# Patient Record
Sex: Female | Born: 1960 | Hispanic: Yes | Marital: Single | Smoking: Former smoker
Health system: Southern US, Community
[De-identification: ages and names within clinical notes are randomized; demographics above are authoritative.]

## PROBLEM LIST (undated history)

## (undated) DIAGNOSIS — D332 Benign neoplasm of brain, unspecified: Secondary | ICD-10-CM

## (undated) DIAGNOSIS — L719 Rosacea, unspecified: Secondary | ICD-10-CM

## (undated) DIAGNOSIS — F329 Major depressive disorder, single episode, unspecified: Secondary | ICD-10-CM

## (undated) DIAGNOSIS — J45909 Unspecified asthma, uncomplicated: Secondary | ICD-10-CM

## (undated) DIAGNOSIS — K219 Gastro-esophageal reflux disease without esophagitis: Secondary | ICD-10-CM

## (undated) DIAGNOSIS — F32A Depression, unspecified: Secondary | ICD-10-CM

## (undated) DIAGNOSIS — R51 Headache: Secondary | ICD-10-CM

## (undated) DIAGNOSIS — R519 Headache, unspecified: Secondary | ICD-10-CM

## (undated) HISTORY — DX: Rosacea, unspecified: L71.9

## (undated) HISTORY — DX: Gastro-esophageal reflux disease without esophagitis: K21.9

## (undated) HISTORY — DX: Major depressive disorder, single episode, unspecified: F32.9

## (undated) HISTORY — DX: Headache, unspecified: R51.9

## (undated) HISTORY — DX: Headache: R51

## (undated) HISTORY — DX: Depression, unspecified: F32.A

## (undated) HISTORY — DX: Unspecified asthma, uncomplicated: J45.909

## (undated) HISTORY — PX: TUBAL LIGATION: SHX77

## (undated) HISTORY — PX: REDUCTION MAMMAPLASTY: SUR839

## (undated) HISTORY — PX: BREAST REDUCTION SURGERY: SHX8

## (undated) HISTORY — DX: Benign neoplasm of brain, unspecified: D33.2

---

## 1995-08-26 HISTORY — PX: BRAIN SURGERY: SHX531

## 2014-12-12 ENCOUNTER — Ambulatory Visit (INDEPENDENT_AMBULATORY_CARE_PROVIDER_SITE_OTHER): Payer: Medicare HMO | Admitting: Neurology

## 2014-12-12 ENCOUNTER — Encounter: Payer: Self-pay | Admitting: Neurology

## 2014-12-12 VITALS — BP 132/78 | HR 74 | Resp 16 | Ht 60.0 in | Wt 139.6 lb

## 2014-12-12 DIAGNOSIS — G43019 Migraine without aura, intractable, without status migrainosus: Secondary | ICD-10-CM

## 2014-12-12 DIAGNOSIS — Z8669 Personal history of other diseases of the nervous system and sense organs: Secondary | ICD-10-CM | POA: Diagnosis not present

## 2014-12-12 DIAGNOSIS — G894 Chronic pain syndrome: Secondary | ICD-10-CM | POA: Insufficient documentation

## 2014-12-12 DIAGNOSIS — Z87898 Personal history of other specified conditions: Secondary | ICD-10-CM

## 2014-12-12 MED ORDER — BACLOFEN 10 MG PO TABS: 5.0000 mg | ORAL_TABLET | Freq: Three times a day (TID) | ORAL | Status: DC | PRN

## 2014-12-12 MED ORDER — SUMATRIPTAN SUCCINATE 50 MG PO TABS: ORAL_TABLET | ORAL | Status: AC

## 2014-12-12 NOTE — Patient Instructions (Addendum)
1.  Stop sumatriptan 25mg  tablets.  Take sumatriptan 50mg  at earliest onset of headache.  May repeat tablet once in 2 hours if needed.  If ineffective, then the next time you have a migraine, take the first dose of sumatriptan along with naproxen 500mg  (over the counter). 2.  For left sided neck and arm cramps, take baclofen 5mg  up to three times daily as needed. 3.  We will get MRI of brain with and without contrast [facility]  12/29/14 11:45am  4.  Follow up in 2 months.

## 2014-12-12 NOTE — Progress Notes (Signed)
NEUROLOGY CONSULTATION NOTE  Richard Holz MRN: [id number] DOB: 1961/01/17  Referring provider: Dr. Vista Lawman Primary care provider: Dr. Vista Lawman  Reason for consult:  Migraine.  HISTORY OF PRESENT ILLNESS: Linda Munoz is a 54 year old right-handed woman from [place] with migraine, asthma, depression, low-back pain and history of tobacco abuse who presents for migraine.  Records reviewed.  Onset:  Since age 26.  Headaches have gotten worse lately because they have been lasting longer. Location:  Left-sided Quality:  pounding Intensity:  8-9/10 Aura:  no Prodrome:  no Associated symptoms:  Photophobia, phonophobia, blurred vision.  Sometimes nausea. Duration:  2 days Frequency:  Twice a month Triggers/exacerbating factors:  no Relieving factors:  no Activity:  Lays down to sleep  Past abortive therapy:  Fioricet (ineffective), hydrocodone Past preventative therapy:  none  Current abortive therapy:  sumatriptan 25mg  (effective but headache returns, also makes sleepy), acetaminophen helpful but headache returns.  She does not take pain reliever at earliest onset of headache. Current preventative therapy:  none Other medication:  doxepin 25mg , clonazepam 0.5-1mg , albuterol, nabumetone, tizanidine 2mg  for back pain  In the late 90s, she had brain surgery for removal of an unspecified benign tumor.  Since that time, she has had chronic pain down the left side of her neck and arm, associated with numbness in the left side of face, arm and leg.  It feels like soreness.  She notes swelling along the left side of the neck.  The pain gets worse when she has a migraine.  She has tried gabapentin in the past, which was ineffective.  Caffeine:  1 cup of coffee daily Alcohol:  no Smoker:  former Diet:  Keeps hydrated Exercise:  no Depression/stress:  Depression (on Paxil, ineffective) Sleep hygiene:  poor Family history of headache:  No family history.  No family history of  aneurysms.  PAST MEDICAL HISTORY: Past Medical History  Diagnosis Date  . Headache   . Asthma     PAST SURGICAL HISTORY: Past Surgical History  Procedure Laterality Date  . Brain surgery      tumor 1997  . Tubal ligation      MEDICATIONS: No current outpatient prescriptions on file prior to visit.   No current facility-administered medications on file prior to visit.    ALLERGIES: Allergies  Allergen Reactions  . Norflex [Orphenadrine Citrate]   . Sulfa Antibiotics     FAMILY HISTORY: Family History  Problem Relation Age of Onset  . Hypertension Sister     SOCIAL HISTORY: History   Social History  . Marital Status: Single    Spouse Name: N/A  . Number of Children: N/A  . Years of Education: N/A   Occupational History  . Not on file.   Social History Main Topics  . Smoking status: Never Smoker   . Smokeless tobacco: Never Used  . Alcohol Use: No  . Drug Use: No  . Sexual Activity: No   Other Topics Concern  . Not on file   Social History Narrative  . No narrative on file    REVIEW OF SYSTEMS: Constitutional: No fevers, chills, or sweats, no generalized fatigue, change in appetite Eyes: No visual changes, double vision, eye pain Ear, nose and throat: No hearing loss, ear pain, nasal congestion, sore throat Cardiovascular: No chest pain, palpitations Respiratory:  No shortness of breath at rest or with exertion, wheezes GastrointestinaI: No nausea, vomiting, diarrhea, abdominal pain, fecal incontinence Genitourinary:  No dysuria, urinary retention or frequency Musculoskeletal:  Neck and arm pain Integumentary: No rash, pruritus, skin lesions Neurological: as above Psychiatric: depression, insomnia Endocrine: No palpitations, fatigue, diaphoresis, mood swings, change in appetite, change in weight, increased thirst Hematologic/Lymphatic:  No anemia, purpura, petechiae. Allergic/Immunologic: no itchy/runny eyes, nasal congestion, recent allergic  reactions, rashes  PHYSICAL EXAM: Filed Vitals:   12/12/14 1441  BP: 132/78  Pulse: 74  Resp: 16   General: No acute distress Head:  Normocephalic/atraumatic Eyes:  fundi unremarkable, without vessel changes, exudates, hemorrhages or papilledema. Neck: supple, left sided tenderness, full range of motion Back: No paraspinal tenderness Heart: regular rate and rhythm Lungs: Clear to auscultation bilaterally. Vascular: No carotid bruits. Neurological Exam: Mental status: alert and oriented to person, place, and time, recent and remote memory intact, fund of knowledge intact, attention and concentration intact, speech fluent and not dysarthric, language intact. Cranial nerves: CN I: not tested CN II: pupils equal, round and reactive to light, visual fields intact, fundi unremarkable, without vessel changes, exudates, hemorrhages or papilledema. CN III, IV, VI:  full range of motion, no nystagmus, no ptosis CN V: endorses reduced light touch sensation on left V1-V3, as well as reduced vibration sensation on left side of forehead. CN VII: upper and lower face symmetric CN VIII: hearing intact CN IX, X: gag intact, uvula midline CN XI: sternocleidomastoid and trapezius muscles intact CN XII: tongue midline Bulk & Tone: normal, no fasciculations. Motor:  5-/5 left triceps and left grip.  Otherwise 5/5. Sensation:  Reduced pinprick sensation in the hands, left arm and leg.  Reduced vibration sensation left arm and leg. Deep Tendon Reflexes:  2+ throughout, toes downgoing Finger to nose testing:  No dysmetria Heel to shin:  No dysmetria Gait:  Normal station and stride.  Able to turn and walk in tandem. Romberg negative.  IMPRESSION: Migraine without aura Left sided pain syndrome History of brain tumor resection  PLAN: Since the headaches are infrequent, we will hold off on a preventative. 1.  At earliest onset of headache, instructed to take sumatriptan 50mg  with or without 500mg   naproxen 2.  Due to muscle cramping/muscle soreness on left side of neck and arm, will try trial of baclofen 5mg  3.  Given history of brain tumor and current worsening of headaches, will get MRI of brain with and without contrast.  45 minutes spent with patient, over 50% spent discussing management.  Thank you for allowing me to take part in the care of this patient.  Metta Clines, DO  CC:  Benito Mccreedy, MD

## 2014-12-29 ENCOUNTER — Ambulatory Visit (HOSPITAL_COMMUNITY): Admission: RE | Admit: 2014-12-29 | Payer: Medicare HMO | Source: Ambulatory Visit

## 2015-01-08 ENCOUNTER — Other Ambulatory Visit: Payer: Self-pay | Admitting: Physician Assistant

## 2015-01-08 DIAGNOSIS — R109 Unspecified abdominal pain: Secondary | ICD-10-CM

## 2015-01-09 ENCOUNTER — Ambulatory Visit
Admission: RE | Admit: 2015-01-09 | Discharge: 2015-01-09 | Disposition: A | Discharge status: Home / Self Care | Payer: Medicare HMO | Source: Ambulatory Visit | Attending: Physician Assistant | Admitting: Physician Assistant

## 2015-01-09 DIAGNOSIS — R109 Unspecified abdominal pain: Secondary | ICD-10-CM

## 2015-01-25 ENCOUNTER — Encounter: Payer: Self-pay | Admitting: Neurology

## 2015-01-25 ENCOUNTER — Ambulatory Visit (INDEPENDENT_AMBULATORY_CARE_PROVIDER_SITE_OTHER): Payer: Medicare HMO | Admitting: Neurology

## 2015-01-25 VITALS — BP 130/70 | HR 70 | Resp 16 | Ht 60.0 in | Wt 140.0 lb

## 2015-01-25 DIAGNOSIS — G894 Chronic pain syndrome: Secondary | ICD-10-CM | POA: Diagnosis not present

## 2015-01-25 DIAGNOSIS — Z8669 Personal history of other diseases of the nervous system and sense organs: Secondary | ICD-10-CM

## 2015-01-25 DIAGNOSIS — G43019 Migraine without aura, intractable, without status migrainosus: Secondary | ICD-10-CM

## 2015-01-25 DIAGNOSIS — Z87898 Personal history of other specified conditions: Secondary | ICD-10-CM

## 2015-01-25 MED ORDER — RIZATRIPTAN BENZOATE 10 MG PO TABS: 10.0000 mg | ORAL_TABLET | ORAL | Status: DC | PRN

## 2015-01-25 MED ORDER — TOPIRAMATE 25 MG PO TABS: 25.0000 mg | ORAL_TABLET | Freq: Every day | ORAL | Status: DC

## 2015-01-25 NOTE — Patient Instructions (Addendum)
1.  Start topiramate 25mg  at bedtime.  Possible side effects include: impaired thinking, sedation, paresthesias (numbness and tingling) and weight loss.  It may cause dehydration and there is a small risk for kidney stones, so make sure to stay hydrated with water during the day.  There is also a very small risk for glaucoma, so if you notice any change in your vision while taking this medication, see an ophthalmologist.   2.  At earliest onset of headache, take Maxalt (rizatriptan) 10mg .  May repeat once in 2 hours if needed.  Do not exceed two tablets in 24 hours. 3.  Get MRI of brain  [facility] 02/05/15 10:45am 4.  CALL IN 4 WEEKS WITH UPDATE AND WE CAN ADJUST DOSE OF MEDICATION 5.  Follow up in 3 months.  6. STOP aspirin  1. Comenzar 25 mg de topiramato al [employer]. Los posibles efectos secundarios incluyen: alteracin de la capacidad, la sedacin, parestesias (entumecimiento y hormigueo) y prdida de [place]. Puede causar la deshidratacin y hay un pequeo riesgo de clculos renales, as que asegrese de [employer] hidratado con Pharmacist, community. Tambin hay un pequeo riesgo para el glaucoma, as que si usted nota algn cambio en su vista mientras est tomando este medicamento, consulte a Theatre stage manager. 2. Al inicio ms temprano del dolor de cabeza, tome Maxalt 10 mg (rizatriptn). Se puede repetir Dollene Cleveland en 2 horas si es necesario. No exceda de dos tabletas en 24 horas. [place] 13/06/16 10:45 am 4. LLAMADA EN 4 SEMANAS CON ACTUALIZACIN Y podemos ajustar dosis de la medicacin 5. Seguimiento en 3 meses. 6. Parar la Octavia Bruckner

## 2015-01-25 NOTE — Progress Notes (Addendum)
NEUROLOGY FOLLOW UP OFFICE NOTE  Linda Munoz [id number]  HISTORY OF PRESENT ILLNESS: Linda Munoz is a 54 year old right-handed woman from [place] with migraine, asthma, depression, low-back pain and history of tobacco abuse who follows up for migraine, left sided pain syndrome and history of brain tumor resection.  Interpretor is present.  UPDATE: Since last visit, her headaches have become more frequent.  She has daily headaches.  She thinks the baclofen made it worse.  As for migraines: Intensity:  8-9/10 Duration:  All day Frequency:  2-3 days per week Current abortive therapy:  sumatriptan 50mg  (ineffective), ASA 81mg  (daily, ineffective) Current preventative therapy:  none  MRI of brain was ordered, which was not performed because she missed the appointment.  HISTORY: Onset:  Since age 63.  Headaches have gotten worse lately because they have been lasting longer. Location:  Left-sided Quality:  pounding Initial Intensity:  8-9/10 Aura:  no Prodrome:  no Associated symptoms:  Photophobia, phonophobia, blurred vision.  Sometimes nausea. Initial Duration:  2 days Initial Frequency:  Twice a month Triggers/exacerbating factors:  no Relieving factors:  no Activity:  Lays down to sleep  Past abortive therapy:  Fioricet (ineffective), hydrocodone, naproxen (caused dizziness) Past preventative therapy:  none  Current abortive therapy:  sumatriptan 25mg  (effective but headache returns, also makes sleepy), acetaminophen helpful but headache returns.  She does not take pain reliever at earliest onset of headache. Current preventative therapy:  none Other medication:  doxepin 25mg , clonazepam 0.5-1mg , albuterol, nabumetone, tizanidine 2mg  for back pain  In the late 90s, she had brain surgery for removal of an unspecified benign tumor.  Since that time, she has had chronic pain down the left side of her neck and arm, associated with numbness in the left side of face, arm  and leg.  It feels like soreness.  She notes swelling along the left side of the neck.  The pain gets worse when she has a migraine.  She has tried gabapentin in the past, which was ineffective.  Caffeine:  1 cup of coffee daily Alcohol:  no Smoker:  former Diet:  Keeps hydrated Exercise:  no Depression/stress:  Depression (on Paxil, ineffective)  PAST MEDICAL HISTORY: Past Medical History  Diagnosis Date  . Headache   . Asthma     MEDICATIONS: Current Outpatient Prescriptions on File Prior to Visit  Medication Sig Dispense Refill  . AZELEX 20 % cream   5  . baclofen (LIORESAL) 10 MG tablet Take 0.5 tablets (5 mg total) by mouth 3 (three) times daily as needed for muscle spasms. 30 each 0  . Butalbital-APAP-Caffeine 50-300-40 MG CAPS   0  . doxepin (SINEQUAN) 25 MG capsule   2  . HYDROcodone-acetaminophen (NORCO/VICODIN) 5-325 MG per tablet   0  . montelukast (SINGULAIR) 10 MG tablet   0  . nabumetone (RELAFEN) 500 MG tablet   2  . PARoxetine (PAXIL) 20 MG tablet   0  . PROAIR HFA 108 (90 BASE) MCG/ACT inhaler   0  . SUMAtriptan (IMITREX) 50 MG tablet Take 1tab at earliest onset of headache.  May repeat x1 in 2 hours if headache persists or recurs. 10 tablet 0  . tiZANidine (ZANAFLEX) 2 MG tablet Take by mouth every 6 (six) hours as needed for muscle spasms.     No current facility-administered medications on file prior to visit.    ALLERGIES: Allergies  Allergen Reactions  . Norflex [Orphenadrine Citrate]   . Sulfa Antibiotics  FAMILY HISTORY: Family History  Problem Relation Age of Onset  . Hypertension Sister     SOCIAL HISTORY: History   Social History  . Marital Status: Single    Spouse Name: N/A  . Number of Children: N/A  . Years of Education: N/A   Occupational History  . Not on file.   Social History Main Topics  . Smoking status: Never Smoker   . Smokeless tobacco: Never Used  . Alcohol Use: No  . Drug Use: No  . Sexual Activity: No    Other Topics Concern  . Not on file   Social History Narrative  . No narrative on file    REVIEW OF SYSTEMS: Constitutional: No fevers, chills, or sweats, no generalized fatigue, change in appetite Eyes: No visual changes, double vision, eye pain Ear, nose and throat: No hearing loss, ear pain, nasal congestion, sore throat Cardiovascular: No chest pain, palpitations Respiratory:  No shortness of breath at rest or with exertion, wheezes GastrointestinaI: No nausea, vomiting, diarrhea, abdominal pain, fecal incontinence Genitourinary:  No dysuria, urinary retention or frequency Musculoskeletal:  No neck pain, back pain Integumentary: No rash, pruritus, skin lesions Neurological: as above Psychiatric: No depression, insomnia, anxiety Endocrine: No palpitations, fatigue, diaphoresis, mood swings, change in appetite, change in weight, increased thirst Hematologic/Lymphatic:  No anemia, purpura, petechiae. Allergic/Immunologic: no itchy/runny eyes, nasal congestion, recent allergic reactions, rashes  PHYSICAL EXAM: Filed Vitals:   01/25/15 0856  BP: 130/70  Pulse: 70  Resp: 16   General: No acute distress Head:  Normocephalic/atraumatic Eyes:  Fundoscopic exam unremarkable without vessel changes, exudates, hemorrhages or papilledema. Neck: supple, no paraspinal tenderness, full range of motion Heart:  Regular rate and rhythm Lungs:  Clear to auscultation bilaterally Back: No paraspinal tenderness Neurological Exam: alert and oriented to person, place, and time. Attention span and concentration intact, recent and remote memory intact, fund of knowledge intact.  Speech fluent and not dysarthric, language intact.  Reduced sensation in left side of face.  Otherwise, CN II-XII intact. Fundoscopic exam unremarkable without vessel changes, exudates, hemorrhages or papilledema.  Bulk and tone normal, muscle strength 5/5 throughout.  Sensation to light touch, temperature and vibration  intact.  Deep tendon reflexes 2+ throughout, toes downgoing.  Finger to nose and heel to shin testing intact.  Gait normal, Romberg negative.  IMPRESSION: Chronic migraine, intractable Left sided pain syndrome History of brain tumor resection  PLAN: 1.  Start topiramate 25mg  at bedtime 2.  Maxalt 10mg  3.  Stop ASA 4.  Call in 4 weeks with update.  Follow up in 3 months. 5.  Reschedule MRI of brain  15 minute spent with patient face to face, over 50% spent discussing management.  Metta Clines, DO  CC:  Benito Mccreedy, MD

## 2015-02-05 ENCOUNTER — Other Ambulatory Visit: Payer: Self-pay | Admitting: Neurology

## 2015-02-05 ENCOUNTER — Ambulatory Visit (HOSPITAL_COMMUNITY)
Admission: RE | Admit: 2015-02-05 | Discharge: 2015-02-05 | Disposition: A | Discharge status: Home / Self Care | Payer: Medicare HMO | Source: Ambulatory Visit | Attending: Neurology | Admitting: Neurology

## 2015-02-05 ENCOUNTER — Telehealth: Payer: Self-pay | Admitting: *Deleted

## 2015-02-05 DIAGNOSIS — Z1389 Encounter for screening for other disorder: Secondary | ICD-10-CM | POA: Insufficient documentation

## 2015-02-05 DIAGNOSIS — Z87898 Personal history of other specified conditions: Secondary | ICD-10-CM

## 2015-02-05 DIAGNOSIS — G43019 Migraine without aura, intractable, without status migrainosus: Secondary | ICD-10-CM | POA: Insufficient documentation

## 2015-02-05 DIAGNOSIS — Z8669 Personal history of other diseases of the nervous system and sense organs: Secondary | ICD-10-CM | POA: Diagnosis not present

## 2015-02-05 DIAGNOSIS — S0095XA Superficial foreign body of unspecified part of head, initial encounter: Secondary | ICD-10-CM

## 2015-02-05 DIAGNOSIS — G894 Chronic pain syndrome: Secondary | ICD-10-CM | POA: Diagnosis not present

## 2015-02-05 MED ORDER — GADOBENATE DIMEGLUMINE 529 MG/ML IV SOLN
14.0000 mL | Freq: Once | INTRAVENOUS | Status: AC | PRN
  Administered 2015-02-05: 14 mL via INTRAVENOUS

## 2015-02-05 NOTE — Telephone Encounter (Signed)
-----   Message from Pieter Partridge, DO sent at 02/05/2015  2:10 PM EDT ----- MRI of brain shows no tumors or other concerning findings.

## 2015-02-05 NOTE — Telephone Encounter (Signed)
Patient is aware of MRI results

## 2015-02-16 ENCOUNTER — Ambulatory Visit: Payer: Medicare HMO | Admitting: Neurology

## 2015-02-21 ENCOUNTER — Ambulatory Visit: Payer: Medicare HMO | Admitting: Neurology

## 2015-03-06 ENCOUNTER — Telehealth: Payer: Self-pay | Admitting: Neurology

## 2015-03-06 ENCOUNTER — Other Ambulatory Visit: Payer: Self-pay | Admitting: *Deleted

## 2015-03-06 DIAGNOSIS — G43009 Migraine without aura, not intractable, without status migrainosus: Secondary | ICD-10-CM

## 2015-03-06 MED ORDER — TOPIRAMATE 50 MG PO TABS: 50.0000 mg | ORAL_TABLET | Freq: Once | ORAL | Status: DC

## 2015-03-06 NOTE — Telephone Encounter (Signed)
For updates regarding migraines, I need to know: 1.  If the abortive medication is effective (in this case the Maxalt).  Is she taking it at earliest onset of headache? 2.  Over the past month, how many days of headache did she have.  We can increase topiramate to 50mg  at bedtime. If she is taking Maxalt at earliest onset of headache and it is still ineffective, we can prescribe her Relpax 40mg , taken at earliest onset of headache.  May repeat once dose in 2 hours if needed.  Do not exceed 2 tablets in 24 hours.  She should update [place] again in 4 weeks.  If Relpax ineffective after giving it a couple of tries, she should call sooner.

## 2015-03-06 NOTE — Telephone Encounter (Signed)
Patient called stating she is still having headache she is taking Topamax 25 mg  As well as Maxalt 10 mg  Please advise

## 2015-03-06 NOTE — Telephone Encounter (Signed)
Pt called to talk to someone about head pn, call back PH# is [phone]

## 2015-05-16 ENCOUNTER — Encounter: Payer: Self-pay | Admitting: Neurology

## 2015-05-16 ENCOUNTER — Ambulatory Visit (INDEPENDENT_AMBULATORY_CARE_PROVIDER_SITE_OTHER): Payer: Medicare HMO | Admitting: Neurology

## 2015-05-16 VITALS — BP 104/64 | HR 80 | Temp 97.7°F | Resp 16 | Ht 60.0 in | Wt 139.5 lb

## 2015-05-16 DIAGNOSIS — Z8669 Personal history of other diseases of the nervous system and sense organs: Secondary | ICD-10-CM

## 2015-05-16 DIAGNOSIS — M542 Cervicalgia: Secondary | ICD-10-CM | POA: Insufficient documentation

## 2015-05-16 DIAGNOSIS — Z87898 Personal history of other specified conditions: Secondary | ICD-10-CM

## 2015-05-16 DIAGNOSIS — G894 Chronic pain syndrome: Secondary | ICD-10-CM

## 2015-05-16 DIAGNOSIS — G43009 Migraine without aura, not intractable, without status migrainosus: Secondary | ICD-10-CM | POA: Diagnosis not present

## 2015-05-16 MED ORDER — TIZANIDINE HCL 2 MG PO TABS: 2.0000 mg | ORAL_TABLET | Freq: Three times a day (TID) | ORAL | Status: DC | PRN

## 2015-05-16 NOTE — Progress Notes (Signed)
NEUROLOGY FOLLOW UP OFFICE NOTE  Linda Munoz [id number]  HISTORY OF PRESENT ILLNESS: Linda Munoz is a 54 year old right-handed woman from [place] with migraine, asthma, depression, low-back pain and history of tobacco abuse who follows up for migraine, left sided pain syndrome and history of brain tumor resection, as well as paresthesias, which is a new issue.  Interpretor is present, although she speaks [place].  Images of brain MRI reviewed.  UPDATE: Migraines have resolved since increasing topiramate to 50mg .  Maxalt also is effective when she gets one.  For her pain syndrome, she was started on nortriptyline 25mg  last week.  She also takes Lyrica.  She continues to have muscle tension in the neck and shoulders.  She also reports numbness and tingling.  She has increased bowel movements since increasing the topiramate.  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  Headaches have gotten worse lately because they have been lasting longer. Location:  Left-sided Quality:  pounding Initial Intensity:  8-9/10; June 8-9/10 Aura:  no Prodrome:  no Associated symptoms:  Photophobia, phonophobia, blurred vision.  Sometimes nausea. Initial Duration:  2 days; June all day Initial Frequency:  Twice a month; June 2-3 days per week Triggers/exacerbating factors:  no Relieving factors:  no Activity:  Lays down to sleep  Past abortive therapy:  Sumatriptan 50mg , ASA 81mg , Fioricet (ineffective), hydrocodone, naproxen (caused dizziness) Past preventative therapy:  none  Current abortive therapy:  sumatriptan 25mg  (effective but headache returns, also makes sleepy), acetaminophen helpful but headache returns.  She does not take pain reliever at earliest onset of headache. Current preventative therapy:   none Other medication:  doxepin 25mg , clonazepam 0.5-1mg , albuterol, nabumetone, tizanidine 2mg  for back pain  In the late 90s, she had brain surgery for removal of an unspecified benign tumor.  Since that time, she has had chronic pain down the left side of her neck and arm, associated with numbness in the left side of face, arm and leg.  It feels like soreness.  She notes swelling along the left side of the neck.  The pain gets worse when she has a migraine.  She has tried gabapentin in the past, which was ineffective.  PAST MEDICAL HISTORY: Past Medical History  Diagnosis Date  . Headache   . Asthma     MEDICATIONS: Current Outpatient Prescriptions on File Prior to Visit  Medication Sig Dispense Refill  . montelukast (SINGULAIR) 10 MG tablet   0  . nabumetone (RELAFEN) 500 MG tablet   2  . PROAIR HFA 108 (90 BASE) MCG/ACT inhaler   0  . rizatriptan (MAXALT) 10 MG tablet Take 1 tablet (10 mg total) by mouth as needed for migraine. May repeat x1 in 2 hours if needed.  Do not exceed 2 tabs in 24 hours 10 tablet 0  . SUMAtriptan (IMITREX) 50 MG tablet Take 1tab at earliest onset of headache.  May repeat x1 in 2 hours if headache persists or recurs. 10 tablet 0  . topiramate (TOPAMAX) 50 MG tablet Take 1 tablet (50 mg total) by mouth once. I tablet  50mg   At HS  30 with 2 refills 30 tablet 2  . AZELEX 20 % cream   5  . baclofen (LIORESAL) 10 MG tablet Take 0.5 tablets (5 mg total) by mouth 3 (three) times daily as needed for  muscle spasms. (Patient not taking: Reported on 05/16/2015) 30 each 0  . Butalbital-APAP-Caffeine 50-300-40 MG CAPS   0  . doxepin (SINEQUAN) 25 MG capsule   2  . HYDROcodone-acetaminophen (NORCO/VICODIN) 5-325 MG per tablet   0  . PARoxetine (PAXIL) 20 MG tablet   0  . topiramate (TOPAMAX) 25 MG tablet Take 1 tablet (25 mg total) by mouth at bedtime. (Patient not taking: Reported on 05/16/2015) 30 tablet 0   No current facility-administered medications on file prior to  visit.    ALLERGIES: Allergies  Allergen Reactions  . Norflex [Orphenadrine Citrate]   . Sulfa Antibiotics     FAMILY HISTORY: Family History  Problem Relation Age of Onset  . Hypertension Sister     SOCIAL HISTORY: Social History   Social History  . Marital Status: Single    Spouse Name: N/A  . Number of Children: 1  . Years of Education: 14   Occupational History  . Not on file.   Social History Main Topics  . Smoking status: Never Smoker   . Smokeless tobacco: Never Used  . Alcohol Use: No  . Drug Use: No  . Sexual Activity: No   Other Topics Concern  . Not on file   Social History Narrative    REVIEW OF SYSTEMS: Constitutional: No fevers, chills, or sweats, no generalized fatigue, change in appetite Eyes: No visual changes, double vision, eye pain Ear, nose and throat: No hearing loss, ear pain, nasal congestion, sore throat Cardiovascular: No chest pain, palpitations Respiratory:  No shortness of breath at rest or with exertion, wheezes GastrointestinaI: No nausea, vomiting, diarrhea, abdominal pain, fecal incontinence Genitourinary:  No dysuria, urinary retention or frequency Musculoskeletal:  Neck pain, back pain Integumentary: No rash, pruritus, skin lesions Neurological: as above Psychiatric: No depression, insomnia, anxiety Endocrine: No palpitations, fatigue, diaphoresis, mood swings, change in appetite, change in weight, increased thirst Hematologic/Lymphatic:  No anemia, purpura, petechiae. Allergic/Immunologic: no itchy/runny eyes, nasal congestion, recent allergic reactions, rashes  PHYSICAL EXAM: Filed Vitals:   05/16/15 1434  BP: 104/64  Pulse: 80  Temp: 97.7 F (36.5 C)  Resp: 16   General: No acute distress.  Patient appears well-groomed.  Head:  Normocephalic/atraumatic Eyes:  Fundoscopic exam unremarkable without vessel changes, exudates, hemorrhages or papilledema. Neck: supple, bilateral paraspinal tenderness, full range of  motion Heart:  Regular rate and rhythm Lungs:  Clear to auscultation bilaterally Back: No paraspinal tenderness Neurological Exam: alert and oriented to person, place, and time. Attention span and concentration intact, recent and remote memory intact, fund of knowledge intact.  Speech fluent and not dysarthric, language intact.  Reduced sensation in left side of face.  Otherwise, CN II-XII intact. Fundoscopic exam unremarkable without vessel changes, exudates, hemorrhages or papilledema.  Bulk and tone normal, muscle strength 5/5 throughout.  Sensation to light touch, temperature and vibration intact.  Deep tendon reflexes 2+ throughout, toes downgoing.  Finger to nose and heel to shin testing intact.  Gait normal, Romberg negative.  IMPRESSION: Migraine without aura Neck pain History of brain tumor resection Numbness and tingling likely side effect of topiramate, although it is not too bad.  PLAN: Topiramate 50mg  daily Try Immodium for diarrhea Maxalt for abortive therapy Tizanidine 2mg  every 8 hours as needed for neck and shoulder pain.  15 minutes spent face to face with patient, over 50% spent discussing management.  Metta Clines, DO  CC:  Benito Mccreedy MD

## 2015-05-16 NOTE — Patient Instructions (Signed)
1.  Continue topiramate 50mg  at bedtime 2.  Take Maxalt 10mg  at earliest onset of headache.  May repeat once if needed in 2 hours. 3.  Take tizanidine 2mg  every 8 hours as needed for neck and shoulder pain 4.  Follow up in 3 months.

## 2015-06-05 ENCOUNTER — Other Ambulatory Visit: Payer: Self-pay | Admitting: Neurology

## 2015-06-05 NOTE — Telephone Encounter (Signed)
Rx sent 

## 2015-09-04 ENCOUNTER — Ambulatory Visit: Payer: Medicare HMO | Admitting: Neurology

## 2015-10-09 ENCOUNTER — Other Ambulatory Visit: Payer: Self-pay | Admitting: Neurology

## 2015-10-09 NOTE — Telephone Encounter (Signed)
Pt last seen in 04/2015. No showed f/u in 1/17. Message sent to reschedule f/u for further refills.

## 2015-11-07 ENCOUNTER — Other Ambulatory Visit: Payer: Self-pay | Admitting: Neurology

## 2015-11-07 NOTE — Telephone Encounter (Signed)
Last OV: 05/16/15 Next OV: 0/0/0 Will deny. Note was sent on last refill to schedule appointment for further refills.

## 2016-09-04 ENCOUNTER — Other Ambulatory Visit: Payer: Self-pay | Admitting: Family Medicine

## 2016-09-04 DIAGNOSIS — R102 Pelvic and perineal pain: Secondary | ICD-10-CM

## 2016-09-09 ENCOUNTER — Ambulatory Visit
Admission: RE | Admit: 2016-09-09 | Discharge: 2016-09-09 | Disposition: A | Discharge status: Home / Self Care | Payer: Medicare Other | Source: Ambulatory Visit | Attending: Family Medicine | Admitting: Family Medicine

## 2016-09-09 ENCOUNTER — Other Ambulatory Visit: Payer: Self-pay | Admitting: Family Medicine

## 2016-09-09 DIAGNOSIS — R102 Pelvic and perineal pain: Secondary | ICD-10-CM

## 2016-09-15 ENCOUNTER — Other Ambulatory Visit: Payer: Self-pay | Admitting: Family Medicine

## 2016-09-15 DIAGNOSIS — N644 Mastodynia: Secondary | ICD-10-CM

## 2016-09-24 ENCOUNTER — Ambulatory Visit
Admission: RE | Admit: 2016-09-24 | Discharge: 2016-09-24 | Disposition: A | Discharge status: Home / Self Care | Payer: Medicare Other | Source: Ambulatory Visit | Attending: Family Medicine | Admitting: Family Medicine

## 2016-09-24 DIAGNOSIS — N644 Mastodynia: Secondary | ICD-10-CM

## 2016-12-16 ENCOUNTER — Encounter: Payer: Self-pay | Admitting: Physical Medicine & Rehabilitation

## 2016-12-16 ENCOUNTER — Encounter: Payer: Medicare Other | Attending: Physical Medicine & Rehabilitation

## 2016-12-16 ENCOUNTER — Ambulatory Visit (HOSPITAL_BASED_OUTPATIENT_CLINIC_OR_DEPARTMENT_OTHER): Payer: Medicare Other | Admitting: Physical Medicine & Rehabilitation

## 2016-12-16 VITALS — BP 127/85 | HR 67

## 2016-12-16 DIAGNOSIS — R202 Paresthesia of skin: Secondary | ICD-10-CM | POA: Diagnosis not present

## 2016-12-16 DIAGNOSIS — M7542 Impingement syndrome of left shoulder: Secondary | ICD-10-CM | POA: Insufficient documentation

## 2016-12-16 DIAGNOSIS — G890 Central pain syndrome: Secondary | ICD-10-CM | POA: Diagnosis not present

## 2016-12-16 DIAGNOSIS — M542 Cervicalgia: Secondary | ICD-10-CM

## 2016-12-16 DIAGNOSIS — M791 Myalgia: Secondary | ICD-10-CM | POA: Insufficient documentation

## 2016-12-16 DIAGNOSIS — Z79899 Other long term (current) drug therapy: Secondary | ICD-10-CM | POA: Insufficient documentation

## 2016-12-16 DIAGNOSIS — Z87898 Personal history of other specified conditions: Secondary | ICD-10-CM

## 2016-12-16 DIAGNOSIS — G894 Chronic pain syndrome: Secondary | ICD-10-CM | POA: Diagnosis not present

## 2016-12-16 NOTE — Progress Notes (Signed)
Shoulder injection Left    Indication:Left Shoulder pain not relieved by medication management and other conservative care.  Informed consent was obtained after describing risks and benefits of the procedure with the patient, this includes bleeding, bruising, infection and medication side effects. The patient wishes to proceed and has given written consent. Patient was placed in a seated position. TheLeft shoulder was marked and prepped with betadine in the subacromial area. A 25-gauge 1-1/2 inch needle was inserted into the subacromial area. After negative draw back for blood, a solution containing 1 mL of 6 mg per ML betamethasone and 4 mL of 1% lidocaine was injected. A band aid was applied. The patient tolerated the procedure well. Post procedure instructions were given.

## 2016-12-16 NOTE — Progress Notes (Signed)
Subjective:     Linda Munoz is a 56 y.o. female who presents for evaluation of neck pain. Event that precipitated these symptoms: Retro-mastoid left cerebellar resection for tumor. Onset of symptoms was 21 Years ago, and have been gradually worsening since that time. Current symptoms are Left side of neck, going into the left shoulder, elbow, hand, and fingers, occasionally left breast. Patient denies weakness in Left shoulder, arm or hand. Patient has had no prior neck problems and No issues prior to left retromastoid cerebellar resection. Previous treatments: physical therapy and medication: NSAID: Not helpful, analgesic: Tried lyrica, gabapentin, tramadol, hydrocodone without benefit, muscle relaxant: tizanidine not helpful, also tried cymbalta.Has had side effect from several meds as listed in allergy section. Patient has been evaluated by neurology, Dr. Tomi Likens, who ordered MRI of the head with and without contrast as listed below. As per his note in 2016, the patient has complained of left-sided headache, neck pain and arm pain since her brain tumor surgery.  She has previously been on gabapentin and Lyrica for arm pain. Has been on Pamelor for headache prophylaxis, Most recent neurology note indicates topiramate 50 mg a day. Tizanidine 2 mg every 8 hours as needed for neck and shoulder pain. Maxalt for headache abortive therapy.  Started on Lexapro for depressive symptoms in January by her primary care physician. Also started on Seroquel by PCP in February Previously has been followed by mental health  Review of Systems Eyes: positive for Blurriness occasional   Please see other note filed for today Objective:    BP 127/85   Pulse 67   SpO2 98%  General:   alert, cooperative, appears stated age and no distress  External Deformity:  absent  ROM Cervical Spine:  normal range of motion, flexion to 45 degrees, extension to 45 degrees, left rotation to 45 degrees, right rotation to 60  degrees, left lateral bending to 45 degrees and right lateral bending to 60 degrees  Midline Tenderness:  absent on the left  Paraspinous tenderness:  moderate on the left  UE Neurologic Exam:  5/5 strength in right deltoid, bicep, tricep, grip, bilateral hip flexor, knee extensor, ankle dorsiflexor, 4/5 left deltoid, bicep, tricep, grip   Left shoulder. Positive impingement sign at 90. Also has pain over the left acromioclavicular joint and increases with adduction. Has tenderness over the upper trapezius on the left side  Prior imaging studies  CLINICAL DATA:  Severe headaches for several days per week not helped by medication. Remote history of brain tumor surgery.   EXAM: MRI HEAD WITHOUT AND WITH CONTRAST   TECHNIQUE: Multiplanar, multiecho pulse sequences of the brain and surrounding structures were obtained without and with intravenous contrast.   CONTRAST:  99mL MULTIHANCE GADOBENATE DIMEGLUMINE 529 MG/ML IV SOLN   COMPARISON:  None.   FINDINGS: Sequelae of prior left retromastoid craniotomy are identified. There is encephalomalacia laterally in the left cerebellar hemisphere. No mass or abnormal enhancement is identified in this region to suggest recurrent tumor.   There is no evidence of acute infarct, intracranial hemorrhage, mass, midline shift, or extra-axial fluid collection. Ventricles are normal in size for age. No significant white matter disease is identified. No abnormal brain parenchymal or meningeal enhancement is identified. Pituitary is grossly unremarkable.   Orbits are unremarkable. Paranasal sinuses and mastoid air cells are clear. Major intracranial vascular flow voids are preserved.   IMPRESSION: 1. Prior left retromastoid craniotomy with underlying cerebellar encephalomalacia. No evidence of recurrent mass. 2. Otherwise unremarkable appearance of  the brain.     Electronically Signed   By: Logan Bores   On: 02/05/2015 13:47  Cervical spine  MRI from November 2017 was reviewed. Pertinent positive finding is mild to moderate left foraminal stenosis at C5-C6. Remaining levels without evidence of significant stenosis, mild degenerative disc was noted, no central stenosis. Assessment:    Cervical myofascial pain as well as chronic postoperative paresthesias, status post left sub-mastoid craniotomy   2. Left shoulder subacromial impingement syndrome Plan:  Continue current medications, she has tried appropriate medications, but these were not helpful for either her paresthesias in her left upper extremity or neck pain.  Discussed appropriate exercises. Follow up in  1 month. Left shoulder injection today

## 2016-12-16 NOTE — Progress Notes (Signed)
   Subjective:    Patient ID: Linda Munoz, female    DOB: [birth year], 56 y.o.   MRN: [id number]  HPI   Pain Inventory Average Pain 8 Pain Right Now 8 My pain is burning, tingling and aching  In the last 24 hours, has pain interfered with the following? General activity 7 Relation with others 6 Enjoyment of life 5 What TIME of day is your pain at its worst? night Sleep (in general) Fair  Pain is worse with: some activites Pain improves with: none Relief from Meds: 0  Mobility walk without assistance ability to climb steps?  yes do you drive?  yes  Function not employed: date last employed 2010  Neuro/Psych numbness depression  Prior Studies Any changes since last visit?  no  Physicians involved in your care Any changes since last visit?  no   Family History  Problem Relation Age of Onset  . Hypertension Sister    Social History   Social History  . Marital status: Single    Spouse name: N/A  . Number of children: 1  . Years of education: 89   Social History Main Topics  . Smoking status: Never Smoker  . Smokeless tobacco: Never Used  . Alcohol use No  . Drug use: No  . Sexual activity: No   Other Topics Concern  . Not on file   Social History Narrative  . No narrative on file   Past Surgical History:  Procedure Laterality Date  . BRAIN SURGERY  1997   tumor 1997  . BREAST REDUCTION SURGERY Bilateral   . TUBAL LIGATION     Past Medical History:  Diagnosis Date  . Asthma   . Headache    There were no vitals taken for this visit.  Opioid Risk Score:   Fall Risk Score:  `1  Depression screen PHQ 2/9  No flowsheet data found.  Review of Systems  Constitutional: Negative.   HENT: Negative.   Eyes: Negative.   Respiratory: Positive for shortness of breath.   Cardiovascular: Negative.   Gastrointestinal: Negative.   Endocrine: Negative.   Genitourinary: Negative.   Musculoskeletal: Negative.   Skin: Negative.     Allergic/Immunologic: Negative.   Neurological: Negative.   Hematological: Negative.   Psychiatric/Behavioral: Negative.   All other systems reviewed and are negative.      Objective:   Physical Exam        Assessment & Plan:

## 2016-12-16 NOTE — Patient Instructions (Signed)
Ejercicios para los hombros (Shoulder Exercises) Consulte al mdico qu ejercicios son seguros para usted. Haga los ejercicios exactamente como se lo haya indicado el mdico y gradelos como se lo hayan indicado. Es normal sentir tirantez, tensin, presin o molestias leves mientras hace estos ejercicios, pero debe detenerse de inmediato si siente dolor repentino o si el dolor empeora.No comience a hacer estos ejercicios hasta que se lo indique el mdico. EJERCICIOS DE AMPLITUD DE MOVIMIENTOS  Estos ejercicios calientan los msculos y las articulaciones, y mejoran el movimiento y la flexibilidad del hombro. Estos ejercicios tambin ayudan a Best boy, el adormecimiento y el hormigueo. En estos ejercicios, el hombro lesionado se estira directamente. EjercicioA: Pndulo  1. Prese cerca de una pared o una superficie de la que pueda sostenerse para mantener el equilibrio. 2. Flexione la cintura y deje que el brazo izquierdo/derecho cuelgue extendido. Use el otro brazo para apoyarse. Mantenga la espalda derecha y no trabe las rodillas. 3. Relaje los msculos del brazo y Adult nurse izquierdo/derecho, y Programmer, systems la cadera y el tronco de modo que el brazo izquierdo/derecho se balancee libremente. El brazo debe balancearse por el movimiento del cuerpo, no por la fuerza de los msculos del brazo o el hombro. 4. Contine moviendo el cuerpo de modo que el brazo se balancee en las siguientes direcciones, como se lo haya indicado el mdico:  De lado a lado.  Hacia delante y Alvin.  En crculos, en el sentido de las agujas del [place] y en sentido contrario. 1. Contine cada movimiento durante __________ segundos o durante el tiempo que le haya indicado el mdico. 2. Vuelva lentamente a la posicin inicial. Repita __________ veces. Realice este ejercicio __________ veces al da. EjercicioB:Flexin, de pie  1. Prese y sostenga un palo de escoba, un bastn o un objeto similar. Coloque las manos  separadas a una distancia un poco mayor que el ancho de sus hombros. [place] izquierda/derecha debe estar con la palma Dwyane Luo arriba, y la otra mano debe estar con la palma hacia abajo. 2. Mantenga el codo extendido y los msculos del hombro relajados. Baje el palo con el brazo sano para levantar el brazo izquierdo/derecho frente a su cuerpo y [employer] la cabeza hasta sentir un estiramiento en el hombro.  Evite encoger el hombro mientras levanta el brazo. Mantenga los omplatos juntos, llvelos hacia el centro de la espalda. 1. Mantenga esta posicin durante __________ segundos. 2. Vuelva lentamente a la posicin inicial. Repita __________ veces. Realice este ejercicio __________ veces al da. EjercicioC: Abduccin, de pie  1. Prese y sostenga un palo de escoba, un bastn o un objeto similar. Coloque las manos separadas a una distancia un poco mayor que el ancho de sus hombros. [place] izquierda/derecha debe estar con la palma Dwyane Luo arriba, y la otra mano debe estar con la palma hacia abajo. 2. Mientras mantiene el codo extendido y los msculos del hombro relajados, empuje el palo por adelante de su cuerpo hacia el lado izquierdo/derecho. [place] el brazo izquierdo/derecho al costado del cuerpo y [employer] la cabeza hasta sentir un estiramiento en el hombro.  No levante el brazo por encima de la altura del hombro, a menos que el mdico se lo indique.  Evite encoger el hombro mientras levanta el brazo. Mantenga los omplatos juntos, llvelos hacia el centro de la espalda. 1. Mantenga esta posicin durante __________ segundos. 2. Vuelva lentamente a la posicin inicial. Repita __________ veces. Realice este ejercicio __________ veces al da. Ejercicio D:Rotacin  interna  1. Coloque la mano izquierda/derecha detrs de la espalda, con la palma [place] arriba. 2. Con la otra mano, cuelgue una banda para ejercicios, una toalla o un objeto similar sobre el hombro. Agarre la banda con la mano  izquierda/derecha, de modo de tener agarrados ambos extremos. 3. Suavemente, tire de la banda hacia arriba hasta sentir un estiramiento en la parte de adelante del hombro izquierdo/derecho.  Evite encoger el hombro mientras levanta el brazo. Mantenga los omplatos juntos, llvelos hacia el centro de la espalda. 1. Mantenga esta posicin durante __________ segundos. 2. Afloje el estiramiento, suelte la banda y [employer]. Repita __________ veces. Realice este ejercicio __________ veces al da. EJERCICIOS DE ESTIRAMIENTO  Estos ejercicios calientan los msculos y las articulaciones, y mejoran el movimiento y la flexibilidad del hombro. Estos ejercicios tambin ayudan a Best boy, el adormecimiento y el hormigueo. Estos ejercicios se hacen con el hombro sano, que ayuda a Training and development officer los msculos del hombro lesionado. EjercicioE: Estiramiento en una esquina (abduccin y rotacin externa)  1. Prese en una entrada de una puerta con un pie adelante del otro, a una corta distancia uno del otro. Esto se denomina escalonamiento. Si no llega a apoyar los [employer] de la [place], prese frente a una esquina de la habitacin. 2. Elija una de estas posiciones, como se lo haya indicado el mdico:  [place] y los antebrazos sobre el marco de la [place], por encima de la cabeza.  [place] y los antebrazos sobre el marco de la puerta, a la altura de la cabeza.  [place] y los antebrazos sobre el marco de la puerta, a la altura de los codos. 1. Lentamente, lleve el peso al pie de adelante hasta sentir un estiramiento en el pecho y en la parte de adelante de los hombros. Mantenga la cabeza y el pecho erguidos, y los msculos abdominales tensionados. 2. Mantenga esta posicin durante __________ segundos. 3. Para aflojar el estiramiento, lleve el peso al pie de atrs. Repita __________ veces. Realice este estiramiento __________ veces al da. Ejercicio F:Extensin, de  pie  1. Prese y sostenga un palo de escoba, un bastn o un objeto similar detrs de la espalda.  Las manos deben estar separadas a una distancia un poco mayor que el ancho de sus hombros.  Las palmas deben estar en direccin contraria a la espalda. 1. Manteniendo los codos extendidos y los msculos de los hombros relajados, aleje el palo de su cuerpo Nurse, children's sentir un estiramiento en el hombro.  No encoja los hombros al Teacher, English as a foreign language. Mantenga los omplatos juntos, llvelos hacia el centro de la espalda. 1. Mantenga esta posicin durante __________ segundos. 2. Vuelva lentamente a la posicin inicial. Repita __________ veces. Realice este ejercicio __________ veces al da. EJERCICIOS DE FORTALECIMIENTO  Estos ejercicios fortalecen el hombro y le otorgan resistencia. La resistencia es la capacidad de usar los msculos durante un tiempo prolongado, incluso despus de que se cansen. Ejercicio G:Rotacin externa  1. Sintese en una silla estable que no tenga apoyabrazos. 2. Ate una banda para ejercicios a la altura del codo del lado izquierdo/derecho. 3. Coloque un objeto blando, como una toalla doblada o una almohada pequea entre la parte superior del brazo izquierdo/derecho y el cuerpo, de forma que el codo est separado del costado del cuerpo a una distancia de unas pocas pulgadas (alrededor de 10cm). 4. Sostenga el extremo de la banda para que quede tensa y no se  afloje. 5. Con el codo presionado sobre el objeto blando, aleje el antebrazo izquierdo/derecho del abdomen. No mueva el cuerpo; solo Financial risk analyst. 6. Mantenga esta posicin durante __________ segundos. 7. Vuelva lentamente a la posicin inicial. Repita __________ veces. Realice este ejercicio __________ veces al da. EjercicioH:Abduccin del hombro  1. Sintese en una silla estable que no tenga apoyabrazos o pngase de pie. 2. Sostenga una pesa de __________ con la mano izquierda/derecha, o una banda para ejercicios  con ambas manos. 3. Comience con los brazos extendidos Wendi Maya y con la palma izquierda/derecha hacia adentro, en direccin a su cuerpo. 4. Lentamente, levante la mano izquierda/derecha hacia el costado. No levante la mano por encima de la altura del hombro, a menos que el mdico le diga que no hay riesgos.  Mantenga los brazos extendidos.  No encoja los hombros al [employer]. Mantenga los omplatos juntos, llvelos hacia el centro de la espalda. 1. Mantenga esta posicin durante __________ segundos. 2. Baje lentamente el brazo y vuelva a la posicin inicial. Repita __________ veces. Realice este ejercicio __________ veces al da. Ejercicio I:Extensin del hombro  1. Sintese en una silla estable que no tenga apoyabrazos o pngase de pie. 2. Ate una banda para ejercicios a un objeto estable que est frente a usted, a la altura del hombro. 3. Sostenga un extremo de la banda para ejercicios en cada mano. Las palmas deben estar enfrentadas. 4. Extienda los codos y [employer] manos a la altura de los hombros. 5. [place] hacia atrs, alejndose del extremo fijo de la banda para ejercicios, hasta que [place] se tense y no quede floja. 6. Junte los omplatos y medida que baja las manos hacia los costados de los muslos. Detngase cuando las manos estn en la misma posicin en ambos costados. No deje que las manos vayan hacia atrs del cuerpo. 7. Mantenga esta posicin durante __________ segundos. 8. Vuelva lentamente a la posicin inicial. Repita __________ veces. Realice este ejercicio __________ veces al da. EjercicioJ:De pie, remo con el hombro  1. Sintese en una silla estable que no tenga apoyabrazos o pngase de pie. 2. Ate una banda para ejercicios a un objeto estable que est frente a usted, de modo que la banda est a la altura de la cintura. 3. Sostenga un extremo de la banda para ejercicios en cada mano. Las palmas deben estar con los pulgares [place] arriba. 4. Flexione  ambos codos en forma de L (aproximadamente a 90grados) y [employer] parte superior de los brazos a los costados. 5. [place] hacia atrs hasta que la banda quede tensa y no se afloje. 6. Lentamente, lleve los codos hacia atrs de su cuerpo. 7. Mantenga esta posicin durante __________ segundos. 8. Vuelva lentamente a la posicin inicial. Repita __________ veces. Realice este ejercicio __________ veces al da. EjercicioK:Prensas de hombro  1. Sintese en una silla estable que tenga apoyabrazos. Sintese erguido, con los pies apoyados en el suelo. 2. Coloque las [employer] apoyabrazos, con los codos flexionados y los dedos apuntando hacia adelante. Las manos deben estar parejas a los lados de su cuerpo. 3. Presione con las [employer] apoyabrazos para levantarse de la silla. Extienda los codos y levntese todo lo que pueda, sin estar incmodo.  Lleve los omplatos hacia abajo y no levante los hombros hacia las [place].  Mantenga los pies en el suelo. A medida que adquiere ms fuerza, los pies deben sostener menos peso del cuerpo [place] se [place]. [place]  posicin durante __________ segundos. 2. Lentamente, vuelva a sentarse en la silla. Repita __________ veces. Realice este ejercicio __________ veces al da. EjercicioL: Flexiones de [employer] pared  1. Prese frente a una pared estable. Coloque los pies separados de la pared a una distancia aproximada de un brazo. 2. Osvaldo Angst adelante y coloque las palmas sobre la pared a la altura de los hombros. 3. Sin levantar los pies del suelo, flexione los codos e inclnese hacia la pared. 4. Mantenga esta posicin durante __________ segundos. 5. Extienda los codos para regresar a la posicin inicial. Repita __________ veces. Realice este ejercicio __________ veces al da. Esta informacin no tiene Marine scientist el consejo del mdico. Asegrese de hacerle al mdico cualquier pregunta que tenga. Document  Released: 02/02/2006 Document Revised: 09/01/2014 Document Reviewed: 04/22/2015 Elsevier Interactive Patient Education  2017 [employer].

## 2017-01-23 ENCOUNTER — Ambulatory Visit: Payer: Medicare Other | Admitting: Physical Medicine & Rehabilitation

## 2017-01-30 ENCOUNTER — Encounter: Payer: Self-pay | Admitting: Physical Medicine & Rehabilitation

## 2017-01-30 ENCOUNTER — Ambulatory Visit (HOSPITAL_BASED_OUTPATIENT_CLINIC_OR_DEPARTMENT_OTHER): Payer: Medicare Other | Admitting: Physical Medicine & Rehabilitation

## 2017-01-30 ENCOUNTER — Encounter: Payer: Medicare Other | Attending: Physical Medicine & Rehabilitation

## 2017-01-30 VITALS — BP 134/84 | HR 61

## 2017-01-30 DIAGNOSIS — M7542 Impingement syndrome of left shoulder: Secondary | ICD-10-CM | POA: Diagnosis not present

## 2017-01-30 DIAGNOSIS — Z79899 Other long term (current) drug therapy: Secondary | ICD-10-CM | POA: Insufficient documentation

## 2017-01-30 DIAGNOSIS — M7918 Myalgia, other site: Secondary | ICD-10-CM | POA: Insufficient documentation

## 2017-01-30 DIAGNOSIS — R202 Paresthesia of skin: Secondary | ICD-10-CM | POA: Insufficient documentation

## 2017-01-30 DIAGNOSIS — M791 Myalgia: Secondary | ICD-10-CM | POA: Diagnosis present

## 2017-01-30 MED ORDER — TIZANIDINE HCL 2 MG PO TABS: 2.0000 mg | ORAL_TABLET | Freq: Three times a day (TID) | ORAL | 0 refills | Status: DC | PRN

## 2017-01-30 NOTE — Progress Notes (Signed)
Subjective:    Patient ID: Linda Munoz, female    DOB: 1961/05/02, 56 y.o.   MRN: [id number]  HPI No improvement after left subacromial bursa injection. Pain his left side of the neck and shoulder blade area. No numbness or tingling in the left arm. No left arm weakness. Patient has a long history of neck pain greater than 20 years.  Tried PT in the past. They did heat as well as electrical stimulation. No history of dry needling Pain Inventory Average Pain 7 Pain Right Now 7 My pain is burning  In the last 24 hours, has pain interfered with the following? General activity 7 Relation with others 7 Enjoyment of life 7 What TIME of day is your pain at its worst? daytime Sleep (in general) Fair  Pain is worse with: . Pain improves with: . Relief from Meds: .  Mobility walk without assistance ability to climb steps?  yes do you drive?  yes  Function disabled: date disabled .  Neuro/Psych depression anxiety  Prior Studies Any changes since last visit?  no  Physicians involved in your care Any changes since last visit?  no   Family History  Problem Relation Age of Onset  . Hypertension Sister    Social History   Social History  . Marital status: Single    Spouse name: N/A  . Number of children: 1  . Years of education: 65   Social History Main Topics  . Smoking status: Never Smoker  . Smokeless tobacco: Never Used  . Alcohol use No  . Drug use: No  . Sexual activity: No   Other Topics Concern  . Not on file   Social History Narrative  . No narrative on file   Past Surgical History:  Procedure Laterality Date  . BRAIN SURGERY  1997   tumor 1997  . BREAST REDUCTION SURGERY Bilateral   . TUBAL LIGATION     Past Medical History:  Diagnosis Date  . Asthma   . Headache    There were no vitals taken for this visit.  Opioid Risk Score:   Fall Risk Score:  `1  Depression screen PHQ 2/9  Depression screen PHQ 2/9 12/16/2016  Decreased  Interest 1  Down, Depressed, Hopeless 1  PHQ - 2 Score 2  Altered sleeping 2  Tired, decreased energy 1  Change in appetite 2  Feeling bad or failure about yourself  0  Trouble concentrating 1  Moving slowly or fidgety/restless 2  Suicidal thoughts 0  PHQ-9 Score 10  Difficult doing work/chores Somewhat difficult    Review of Systems  Constitutional: Positive for unexpected weight change.  HENT: Negative.   Eyes: Negative.   Respiratory: Negative.   Cardiovascular: Negative.   Gastrointestinal: Negative.   Endocrine: Negative.   Genitourinary: Negative.   Musculoskeletal: Negative.   Skin: Negative.   Allergic/Immunologic: Negative.   Neurological: Negative.   Hematological: Negative.   Psychiatric/Behavioral: Negative.   All other systems reviewed and are negative.      Objective:   Physical Exam  Constitutional: She appears well-developed and well-nourished.  HENT:  Head: Normocephalic and atraumatic.  Nursing note and vitals reviewed.   Tenderness to palpation and some increased tone in the left trapezius, left levator, left infraspinatus. No tenderness over the right trapezius, levator or infraspinatus. Motor strength is normal in the upper limbs. Neck range of motion is within functional limits, although she does have some pain with neck range of motion  Assessment & Plan:  1. Chronic left-sided neck and shoulder pain. She has tenderness over the left trapezius, left levator as well as left infraspinatus. She has increased tone there. I think much of her pain is related to myofascial pain. She's never had dry needling or trigger point injections.  Continued tizanidine 2-4 mg daily at bedtime  Trigger Point Injection  Indication: Left cervical, parascapular Myofascial pain not relieved by medication management and other conservative care.  Informed consent was obtained after describing risk and benefits of the procedure with the patient, this includes  bleeding, bruising, infection and medication side effects.  The patient wishes to proceed and has given written consent.  The patient was placed in a seated position.  The Left trapezius and Left levator and Left infraspinatus area was marked and prepped with Betadine.  It was entered with a 25-gauge 1-1/2 inch needle and 1 mL of 1% lidocaine was injected into each of 3 trigger points with positive twitch sign at each of 3 areas, trigger point deactivation was performed after negative draw back for blood.  The patient tolerated the procedure well.  Post procedure instructions were given.

## 2017-01-30 NOTE — Patient Instructions (Signed)

## 2017-02-10 ENCOUNTER — Telehealth: Payer: Self-pay | Admitting: Physical Therapy

## 2017-02-10 NOTE — Telephone Encounter (Signed)
Attempted to call on 6/14, 6/18 & 6/19. Unable to leave message, no voice mail

## 2017-03-03 NOTE — Telephone Encounter (Signed)
Attempted to call on 03/03/17, phone disconnected

## 2017-03-13 ENCOUNTER — Encounter: Payer: Self-pay | Admitting: Physical Medicine & Rehabilitation

## 2017-03-13 ENCOUNTER — Encounter: Payer: Medicare Other | Attending: Physical Medicine & Rehabilitation

## 2017-03-13 ENCOUNTER — Ambulatory Visit (HOSPITAL_BASED_OUTPATIENT_CLINIC_OR_DEPARTMENT_OTHER): Payer: Medicare Other | Admitting: Physical Medicine & Rehabilitation

## 2017-03-13 VITALS — BP 125/75 | HR 65

## 2017-03-13 DIAGNOSIS — R202 Paresthesia of skin: Secondary | ICD-10-CM | POA: Diagnosis not present

## 2017-03-13 DIAGNOSIS — M7918 Myalgia, other site: Secondary | ICD-10-CM

## 2017-03-13 DIAGNOSIS — Z79899 Other long term (current) drug therapy: Secondary | ICD-10-CM | POA: Diagnosis not present

## 2017-03-13 DIAGNOSIS — M791 Myalgia: Secondary | ICD-10-CM | POA: Diagnosis present

## 2017-03-13 DIAGNOSIS — M7542 Impingement syndrome of left shoulder: Secondary | ICD-10-CM | POA: Diagnosis not present

## 2017-03-13 MED ORDER — TIZANIDINE HCL 4 MG PO TABS: 4.0000 mg | ORAL_TABLET | Freq: Every day | ORAL | 2 refills | Status: DC

## 2017-03-13 NOTE — Progress Notes (Signed)
Subjective:    Patient ID: Linda Munoz, female    DOB: 1960-09-16, 56 y.o.   MRN: [id number]  HPI Left handed No improvement with Left subacromial  inj Trigger point injection 01/30/2017 , left upper trapezius, left levator. Left infraspinatus, produced 2 day improvement in pain in the left neck and shoulder area Has had physical therapy years ago but never has tried dry needling. Pain Inventory Average Pain 7 Pain Right Now 7 My pain is sharp  In the last 24 hours, has pain interfered with the following? General activity 7 Relation with others 7 Enjoyment of life 0 What TIME of day is your pain at its worst? evening Sleep (in general) Fair  Pain is worse with: some activites Pain improves with: . Relief from Meds: 5  Mobility walk without assistance ability to climb steps?  no do you drive?  yes  Function Do you have any goals in this area?  no  Neuro/Psych No problems in this area  Prior Studies Any changes since last visit?  no  Physicians involved in your care Any changes since last visit?  no   Family History  Problem Relation Age of Onset  . Hypertension Sister    Social History   Social History  . Marital status: Single    Spouse name: N/A  . Number of children: 1  . Years of education: 90   Social History Main Topics  . Smoking status: Never Smoker  . Smokeless tobacco: Never Used  . Alcohol use No  . Drug use: No  . Sexual activity: No   Other Topics Concern  . Not on file   Social History Narrative  . No narrative on file   Past Surgical History:  Procedure Laterality Date  . BRAIN SURGERY  1997   tumor 1997  . BREAST REDUCTION SURGERY Bilateral   . TUBAL LIGATION     Past Medical History:  Diagnosis Date  . Asthma   . Headache    There were no vitals taken for this visit.  Opioid Risk Score:   Fall Risk Score:  `1  Depression screen PHQ 2/9  Depression screen PHQ 2/9 12/16/2016  Decreased Interest 1  Down,  Depressed, Hopeless 1  PHQ - 2 Score 2  Altered sleeping 2  Tired, decreased energy 1  Change in appetite 2  Feeling bad or failure about yourself  0  Trouble concentrating 1  Moving slowly or fidgety/restless 2  Suicidal thoughts 0  PHQ-9 Score 10  Difficult doing work/chores Somewhat difficult     Review of Systems  Constitutional: Positive for appetite change and unexpected weight change.  HENT: Negative.   Eyes: Negative.   Respiratory: Negative.   Cardiovascular: Negative.   Gastrointestinal: Negative.   Endocrine: Negative.   Genitourinary: Negative.   Musculoskeletal: Negative.   Skin: Negative.   Allergic/Immunologic: Negative.   Neurological: Negative.   Hematological: Negative.   Psychiatric/Behavioral: Negative.   All other systems reviewed and are negative.      Objective:   Physical Exam  Constitutional: She is oriented to person, place, and time. She appears well-developed and well-nourished.  HENT:  Head: Normocephalic and atraumatic.  Eyes: Pupils are equal, round, and reactive to light. Conjunctivae and EOM are normal.  Neck: Normal range of motion.  Neurological: She is alert and oriented to person, place, and time.  Psychiatric: She has a normal mood and affect.  Nursing note and vitals reviewed.  motor strength is 5/5 bilateral deltoid,  biceps, triceps, grip, Sensation normal. Bilateral upper limbs No evidence of atrophy in the upper extremities. No pain with shoulder range of motion Tenderness over the left trapezius, left levator scapula      Assessment & Plan:  1. Cervical myofascial pain with good response to trigger point injection performed on 01/30/2017. Left upper trapezius, left levator scapula, left infraspinatus were injected. Temporary relief. Would recommend physical therapy referral for dry needling of these muscle groups as well as some stretching and strengthening. Return to clinic in 8 weeks

## 2017-03-13 NOTE — Patient Instructions (Signed)
Please take 1 tablet of tizanidine at night, you are now on a 4 mg dose, not 2 mg   Physical therapy will contact you to set up an appointment for dry needling

## 2017-03-31 ENCOUNTER — Encounter: Payer: Self-pay | Admitting: Physical Therapy

## 2017-03-31 ENCOUNTER — Ambulatory Visit: Payer: Medicare Other | Attending: Physical Medicine & Rehabilitation | Admitting: Physical Therapy

## 2017-03-31 DIAGNOSIS — R252 Cramp and spasm: Secondary | ICD-10-CM | POA: Diagnosis present

## 2017-03-31 DIAGNOSIS — M542 Cervicalgia: Secondary | ICD-10-CM | POA: Insufficient documentation

## 2017-03-31 DIAGNOSIS — R293 Abnormal posture: Secondary | ICD-10-CM | POA: Insufficient documentation

## 2017-03-31 NOTE — Therapy (Signed)
[place], [place], [address] Phone: [phone]   Fax:  [phone]  Physical Therapy Evaluation  Patient Details  Name: Linda Munoz MRN: [id number] Date of Birth: 04-Jan-1961 Referring Provider: Letta Pate  Encounter Date: 03/31/2017      PT End of Session - 03/31/17 1530    Visit Number 1   Date for PT Re-Evaluation 05/31/17   PT Start Time 1436   PT Stop Time 1531   PT Time Calculation (min) 55 min   Activity Tolerance Patient tolerated treatment well   Behavior During Therapy [facility] for tasks assessed/performed      Past Medical History:  Diagnosis Date  . Asthma   . Headache     Past Surgical History:  Procedure Laterality Date  . BRAIN SURGERY  1997   tumor 1997  . BREAST REDUCTION SURGERY Bilateral   . TUBAL LIGATION      There were no vitals filed for this visit.       Subjective Assessment - 03/31/17 1445    Subjective Patient reports that she had a brain surgery in 1997, she reports that she has always had some tightness in the left neck and upper trap area, she reports that she feels like it is worse over the past 3 years.  She is unsure of a cause.    The MD feels like it is all myofascial.     Patient Stated Goals have less pain   Currently in Pain? Yes   Pain Score 7    Pain Location Neck   Pain Orientation Left   Pain Descriptors / Indicators Tightness;Aching   Pain Type Chronic pain   Pain Radiating Towards at times has pain in the hand, reports that at times the hand can be purple on the left   Pain Onset More than a month ago   Pain Frequency Constant   Aggravating Factors  right rotation makes it worse, winter , pain up to a 10/10   Pain Relieving Factors pain meds, the pain at best is a 7/10   Effect of Pain on Daily Activities reports she tries to do everything but has pain all the time            [facility] PT Assessment - 03/31/17 0001       Assessment   Medical Diagnosis left neck and shoulder myofascial pain   Referring Provider Kirsteins   Onset Date/Surgical Date 02/28/17   Hand Dominance Left   Next MD Visit 1 months   Prior Therapy 4 years ago     Precautions   Precautions None     Balance Screen   Has the patient fallen in the past 6 months No   Has the patient had a decrease in activity level because of a fear of falling?  No   Is the patient reluctant to leave their home because of a fear of falling?  No     Home Environment   Additional Comments lives alone, does her own housework     Prior Function   Level of Independence Independent   Vocation On disability   Vocation Requirements back injury, ruptured disc   Leisure treadmill 3x/week     Posture/Postural Control   Posture Comments rounded shoulders, leans to the right away from the painful side, head is in a little right side bending     ROM / Strength   AROM / PROM / Strength AROM;Strength  AROM   Overall AROM Comments Cervical ROM was decreased 50% for all motions, she had pain with all motions, her left side bending was decreased 100%, Left shoulder flexion 135 degrees, ER/IR WFL's but slow and painful, Abduction 135 degrees     Strength   Overall Strength Comments 4-/5 for the UE's with pain with MMT on the left     Palpation   Palpation comment She has significant tenderness in the left upper trap, the levator, the rhomboid and the cervical parapsinals on the left, she has a visible size difference in the upper trap, she has a HA on a weekly basis and reports that at times it will last for days     Special Tests    Special Tests Thoracic Outlet Syndrome   Thoracic Outlet Syndrome  Adson Test;Arms overhead     Adson Test   Findings Negative   Side  Left     Arms overhead    Side Left   Comment diminshed but did not completely go away            Objective measurements completed on examination: See above findings.             Trigger Point Dry Needling - 03/31/17 1529    Consent Given? Yes   Education Handout Provided Yes   Muscles Treated Upper Body Upper trapezius;Levator scapulae   Upper Trapezius Response Palpable increased muscle length   Levator Scapulae Response Twitch response elicited;Palpable increased muscle length              PT Education - 03/31/17 1529    Education provided Yes   Education Details scapular retraction, shoulder elevation, upper trap and levator stretch   Person(s) Educated Patient   Methods Explanation;Demonstration;Handout   Comprehension Verbalized understanding          PT Short Term Goals - 03/31/17 1539      PT SHORT TERM GOAL #1   Title independent with initial HEP   Time 2   Period Weeks   Status New           PT Long Term Goals - 03/31/17 1539      PT LONG TERM GOAL #1   Title increase cervical ROM 25%   Time 8   Period Weeks   Status New     PT LONG TERM GOAL #2   Title decrease pain 25%   Time 8   Period Weeks   Status New     PT LONG TERM GOAL #3   Title decrease HA frequency 25%   Time 8   Period Weeks   Status New     PT LONG TERM GOAL #4   Title report cook a meal without pain >7/10   Time 8   Period Weeks   Status New                Plan - 03/31/17 1533    Clinical Impression Statement Patient reports that she has had left neck pain for many years, she reports that she had a brain surgery in 1997 and feels like she has always had some pain since that time, reports that over the past 3 years she has had increased neck, pain and HA.  She has significant spasms of the left upper trap the levator, the parapsinals and the rhomboids.  She is very tender and tight in these areas, there seems to be some trigger points here as with pressure they cause pain  in the head and in the left arm.  She does have a history of low back pain and had difficulty lying on her back.  The left upper trap is much larger than  the right   Clinical Presentation Stable   Clinical Decision Making Low   Rehab Potential Fair   PT Frequency 1x / week   PT Duration 8 weeks   PT Treatment/Interventions ADLs/Self Care Home Management;Cryotherapy;Electrical Stimulation;Iontophoresis 4mg /ml Dexamethasone;Ultrasound;Moist Heat;Traction;Therapeutic activities;Therapeutic exercise;Neuromuscular re-education;Patient/family education;Manual techniques   PT Next Visit Plan See if she had any results with the dry needling   Consulted and Agree with Plan of Care Patient      Patient will benefit from skilled therapeutic intervention in order to improve the following deficits and impairments:  Decreased activity tolerance, Decreased mobility, Decreased strength, Postural dysfunction, Improper body mechanics, Impaired flexibility, Pain, Increased muscle spasms, Increased fascial restricitons, Decreased range of motion  Visit Diagnosis: Cervicalgia - Plan: PT plan of care cert/re-cert  Cramp and spasm - Plan: PT plan of care cert/re-cert  Abnormal posture - Plan: PT plan of care cert/re-cert      G-Codes - 80/03/49 1541    Functional Assessment Tool Used (Outpatient Only) foto 68% limitation   Functional Limitation Self care   Self Care Current Status (Z7915) At least 60 percent but less than 80 percent impaired, limited or restricted   Self Care Goal Status (A5697) At least 40 percent but less than 60 percent impaired, limited or restricted       Problem List Patient Active Problem List   Diagnosis Date Noted  . Myofascial pain on left side 01/30/2017  . 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 Subacromial impingement of left shoulder 12/16/2016  . Neck pain 05/16/2015  . Intractable migraine without aura and without status migrainosus 12/12/2014  . Chronic pain syndrome 12/12/2014  . History of brain tumor 12/12/2014    Sumner Boast., PT 03/31/2017, 3:43 PM  [place], [place], [address] Phone: [phone]   Fax:  [phone]  Name: Kessa Fairbairn MRN: [id number] Date of Birth: [birth year]

## 2017-04-07 ENCOUNTER — Ambulatory Visit: Payer: Medicare Other | Admitting: Physical Therapy

## 2017-04-07 DIAGNOSIS — M542 Cervicalgia: Secondary | ICD-10-CM | POA: Diagnosis not present

## 2017-04-07 DIAGNOSIS — R293 Abnormal posture: Secondary | ICD-10-CM

## 2017-04-07 DIAGNOSIS — R252 Cramp and spasm: Secondary | ICD-10-CM

## 2017-04-07 NOTE — Therapy (Signed)
[place], [place], [address] Phone: [phone]   Fax:  [phone]  Physical Therapy Treatment  Patient Details  Name: Linda Munoz MRN: [id number] Date of Birth: 1960/09/21 Referring Provider: Letta Pate  Encounter Date: 04/07/2017      PT End of Session - 04/07/17 1427    Visit Number 2   Date for PT Re-Evaluation 05/31/17   PT Start Time 3532   PT Stop Time 1434   PT Time Calculation (min) 38 min   Activity Tolerance Patient tolerated treatment well   Behavior During Therapy [facility] for tasks assessed/performed      Past Medical History:  Diagnosis Date  . Asthma   . Headache     Past Surgical History:  Procedure Laterality Date  . BRAIN SURGERY  1997   tumor 1997  . BREAST REDUCTION SURGERY Bilateral   . TUBAL LIGATION      There were no vitals filed for this visit.      Subjective Assessment - 04/07/17 1357    Subjective Patient reports that she is worse today, reports waking up at 2AM and having pain since then, reports pain a 10/10 int he left upper trap   Currently in Pain? Yes   Pain Score 10-Worst pain ever   Pain Location Neck   Pain Orientation Left   Pain Descriptors / Indicators Aching;Tightness   Aggravating Factors  unsure   Pain Relieving Factors nothing                         [facility] Adult PT Treatment/Exercise - 04/07/17 0001      Modalities   Modalities Electrical Stimulation;Moist Heat     Moist Heat Therapy   Number Minutes Moist Heat 15 Minutes   Moist Heat Location Cervical;Shoulder     Electrical Stimulation   Electrical Stimulation Location left upper trap and into the rhomboid area   Electrical Stimulation Action IFC   Electrical Stimulation Parameters sitting    Electrical Stimulation Goals Pain     Manual Therapy   Manual Therapy Soft tissue mobilization   Soft tissue mobilization Gentle STM to the left upper trap,  rhomboid, SCM, she was very tender to any touch, pulling away at times, a lot of knots palpable                  PT Short Term Goals - 04/07/17 1431      PT SHORT TERM GOAL #1   Title independent with initial HEP   Status On-going           PT Long Term Goals - 03/31/17 1539      PT LONG TERM GOAL #1   Title increase cervical ROM 25%   Time 8   Period Weeks   Status New     PT LONG TERM GOAL #2   Title decrease pain 25%   Time 8   Period Weeks   Status New     PT LONG TERM GOAL #3   Title decrease HA frequency 25%   Time 8   Period Weeks   Status New     PT LONG TERM GOAL #4   Title report cook a meal without pain >7/10   Time 8   Period Weeks   Status New               Plan - 04/07/17 1428    Clinical  Impression Statement Patient reports that she has had a lot of pain over the weekend, rating pain a 10/10 now, she is unsure of a cause.  She is very tight and very tender, resistant to any palpation, she did not want to try the dry needling today due to "I hurt too much", Spasms, tenderness and hyper sensitivity all along the upper traps, the SCM and the rhomboids on the left.   PT Next Visit Plan may have to try some myofascial release vs dry needling   Consulted and Agree with Plan of Care Patient      Patient will benefit from skilled therapeutic intervention in order to improve the following deficits and impairments:  Decreased activity tolerance, Decreased mobility, Decreased strength, Postural dysfunction, Improper body mechanics, Impaired flexibility, Pain, Increased muscle spasms, Increased fascial restricitons, Decreased range of motion  Visit Diagnosis: Cervicalgia  Cramp and spasm  Abnormal posture     Problem List Patient Active Problem List   Diagnosis Date Noted  . Myofascial pain on left side 01/30/2017  . 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 Subacromial impingement of left shoulder 12/16/2016  . Neck pain 05/16/2015   . Intractable migraine without aura and without status migrainosus 12/12/2014  . Chronic pain syndrome 12/12/2014  . History of brain tumor 12/12/2014    Sumner Boast., PT 04/07/2017, 2:32 PM  [place], [place], [address] Phone: [phone]   Fax:  [phone]  Name: Linda Munoz MRN: [id number] Date of Birth: 1961-03-31

## 2017-04-16 ENCOUNTER — Encounter: Payer: Self-pay | Admitting: Physical Therapy

## 2017-04-16 ENCOUNTER — Ambulatory Visit: Payer: Medicare Other | Admitting: Physical Therapy

## 2017-04-16 DIAGNOSIS — R252 Cramp and spasm: Secondary | ICD-10-CM

## 2017-04-16 DIAGNOSIS — R293 Abnormal posture: Secondary | ICD-10-CM

## 2017-04-16 DIAGNOSIS — M542 Cervicalgia: Secondary | ICD-10-CM | POA: Diagnosis not present

## 2017-04-16 NOTE — Therapy (Signed)
[place], [place], [address] Phone: [phone]   Fax:  [phone]  Physical Therapy Treatment  Patient Details  Name: Linda Munoz MRN: [id number] Date of Birth: 23-Jul-1961 Referring Provider: Letta Pate  Encounter Date: 04/16/2017      PT End of Session - 04/16/17 0923    Visit Number 3   Date for PT Re-Evaluation 05/31/17   PT Start Time 0840   PT Stop Time 0930   PT Time Calculation (min) 50 min   Activity Tolerance Patient limited by pain   Behavior During Therapy [facility] for tasks assessed/performed      Past Medical History:  Diagnosis Date  . Asthma   . Headache     Past Surgical History:  Procedure Laterality Date  . BRAIN SURGERY  1997   tumor 1997  . BREAST REDUCTION SURGERY Bilateral   . TUBAL LIGATION      There were no vitals filed for this visit.      Subjective Assessment - 04/16/17 0856    Subjective Reports a little better today, but really no big changes, still hurting                         [facility] Adult PT Treatment/Exercise - 04/16/17 0001      Modalities   Modalities Traction     Moist Heat Therapy   Number Minutes Moist Heat 15 Minutes   Moist Heat Location Cervical;Shoulder     Electrical Stimulation   Electrical Stimulation Location left upper trap and into the rhomboid area   Electrical Stimulation Action IFC   Electrical Stimulation Parameters sitting   Electrical Stimulation Goals Pain     Traction   Type of Traction Cervical   Min (lbs) 8   Hold Time static   Time 10     Manual Therapy   Manual Therapy Soft tissue mobilization   Soft tissue mobilization Gentle STM to the left upper trap, rhomboid, SCM, she was very tender to any touch, pulling away at times, a lot of knots palpable, worked a little on lymph drainage at base of the neck and the clavicle area                  PT Short Term Goals - 04/16/17  0925      PT SHORT TERM GOAL #1   Title independent with initial HEP   Status Achieved           PT Long Term Goals - 03/31/17 1539      PT LONG TERM GOAL #1   Title increase cervical ROM 25%   Time 8   Period Weeks   Status New     PT LONG TERM GOAL #2   Title decrease pain 25%   Time 8   Period Weeks   Status New     PT LONG TERM GOAL #3   Title decrease HA frequency 25%   Time 8   Period Weeks   Status New     PT LONG TERM GOAL #4   Title report cook a meal without pain >7/10   Time 8   Period Weeks   Status New               Plan - 04/16/17 0160    Clinical Impression Statement I tried traction today at 8# after looking at past x-rays and MRI that showed DDD and  stenosis, she was not a fan, tried some lymph drainage, she is resistant to touch, did not like the dry needling, stretches, reports that all seem to increase her pain, I spoke with her about having 21 years of pain and that it may take some time before we can help secondary to the large size of the knot in the upper trap and her so painful to any touch   PT Next Visit Plan see how todays treatment did   Consulted and Agree with Plan of Care Patient      Patient will benefit from skilled therapeutic intervention in order to improve the following deficits and impairments:     Visit Diagnosis: Cervicalgia  Cramp and spasm  Abnormal posture     Problem List Patient Active Problem List   Diagnosis Date Noted  . Myofascial pain on left side 01/30/2017  . 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 Subacromial impingement of left shoulder 12/16/2016  . Neck pain 05/16/2015  . Intractable migraine without aura and without status migrainosus 12/12/2014  . Chronic pain syndrome 12/12/2014  . History of brain tumor 12/12/2014    Sumner Boast., PT 04/16/2017, 9:26 AM  [place], [place], [address] Phone:  [phone]   Fax:  [phone]  Name: Linda Munoz MRN: [id number] Date of Birth: 02-Dec-1960

## 2017-05-08 ENCOUNTER — Ambulatory Visit: Payer: Medicare Other | Admitting: Physical Medicine & Rehabilitation

## 2017-05-08 ENCOUNTER — Encounter: Payer: Self-pay | Admitting: Physical Medicine & Rehabilitation

## 2017-05-08 ENCOUNTER — Encounter: Payer: Medicare Other | Attending: Physical Medicine & Rehabilitation

## 2017-05-08 ENCOUNTER — Ambulatory Visit (HOSPITAL_BASED_OUTPATIENT_CLINIC_OR_DEPARTMENT_OTHER): Payer: Medicare Other | Admitting: Physical Medicine & Rehabilitation

## 2017-05-08 VITALS — BP 131/89 | HR 74 | Resp 14

## 2017-05-08 DIAGNOSIS — M7542 Impingement syndrome of left shoulder: Secondary | ICD-10-CM | POA: Diagnosis not present

## 2017-05-08 DIAGNOSIS — M791 Myalgia: Secondary | ICD-10-CM | POA: Insufficient documentation

## 2017-05-08 DIAGNOSIS — R202 Paresthesia of skin: Secondary | ICD-10-CM | POA: Insufficient documentation

## 2017-05-08 DIAGNOSIS — M5412 Radiculopathy, cervical region: Secondary | ICD-10-CM

## 2017-05-08 DIAGNOSIS — Z79899 Other long term (current) drug therapy: Secondary | ICD-10-CM | POA: Diagnosis not present

## 2017-05-08 MED ORDER — CYCLOBENZAPRINE HCL 5 MG PO TABS: 5.0000 mg | ORAL_TABLET | Freq: Three times a day (TID) | ORAL | 1 refills | Status: AC | PRN

## 2017-05-08 NOTE — Patient Instructions (Signed)
I have referred you to another doctor that does cervical epidural injections. They will call to schedule your appointment. If that injection is helpful for you. You can continue seeing Dr. Maryjean Ka.

## 2017-05-08 NOTE — Progress Notes (Signed)
Subjective:    Patient ID: Linda Munoz, female    DOB: 1961-05-08, 56 y.o.   MRN: [id number]  Linda Munoz is a 56 y.o. female who presents for evaluation of neck pain. Event that precipitated these symptoms: Retro-mastoid left cerebellar resection for tumor. Onset of symptoms was 21 Years ago, and have been gradually worsening since that time. Current symptoms are Left side of neck, going into the left shoulder, elbow, hand, and fingers, occasionally left breast. Patient denies weakness in Left shoulder, arm or hand. Patient has had no prior neck problems and No issues prior to left retromastoid cerebellar resection. Previous treatments: physical therapy and medication: NSAID: Not helpful, analgesic: Tried lyrica, gabapentin, tramadol, hydrocodone without benefit, muscle relaxant: tizanidine not helpful, also tried cymbalta.Has had side effect from several meds as listed in allergy section. Patient has been evaluated by neurology, Dr. Tomi Likens, who ordered MRI of the head with and without contrast as listed below. As per his note in 2016, the patient has complained of left-sided headache, neck pain and arm pain since her brain tumor surgery.  She has previously been on gabapentin and Lyrica for arm pain. Has been on Pamelor for headache prophylaxis, Most recent neurology note indicates topiramate 50 mg a day. HPI   Pain Inventory Average Pain 10 Pain Right Now 10 My pain is constant  In the last 24 hours, has pain interfered with the following? General activity 8 Relation with others 8 Enjoyment of life 8 What TIME of day is your pain at its worst? all Sleep (in general) Poor  Pain is worse with: walking, bending, sitting, inactivity, standing and some activites Pain improves with: nothing Relief from Meds: 0  Mobility walk without assistance ability to climb steps?  yes  Function not employed: date last employed .  Neuro/Psych anxiety  Prior Studies Any changes since  last visit?  no EXAM:  MRI CERVICAL SPINE WITHOUT CONTRAST    TECHNIQUE:  Multiplanar, multisequence MR imaging of the cervical spine was  performed. No intravenous contrast was administered.    COMPARISON:None.    FINDINGS:  Alignment: Mild reversal of lordosis.    Vertebrae: No fracture, evidence of discitis, or bone lesion.    Cord: Normal signal and morphology.    Posterior Fossa, vertebral arteries, paraspinal tissues: Negative.    Disc levels:    C2-3: Borderline facet spurring.No impingement    C3-4: Mild facet spurring. Tiny central disc protrusion. No  impingement    C4-5: Unremarkable    C5-6: Disc narrowing with left more than right uncovertebral  ridging. Negative facets. Mild to moderate left foraminal narrowing.  Patent canal.    C6-7: Unremarkable.    C7-T1:Mild facet spurring.No herniation or impingement Physicians involved in your care Any changes since last visit?  no   Family History  Problem Relation Age of Onset  . Hypertension Sister    Social History   Social History  . Marital status: Single    Spouse name: N/A  . Number of children: 1  . Years of education: 86   Social History Main Topics  . Smoking status: Never Smoker  . Smokeless tobacco: Never Used  . Alcohol use No  . Drug use: No  . Sexual activity: No   Other Topics Concern  . None   Social History Narrative  . None   Past Surgical History:  Procedure Laterality Date  . BRAIN SURGERY  1997   tumor 1997  . BREAST REDUCTION SURGERY Bilateral   .  TUBAL LIGATION     Past Medical History:  Diagnosis Date  . Asthma   . Headache    BP 131/89 (BP Location: Right Arm, Patient Position: Sitting, Cuff Size: Normal)   Pulse 74   Resp 14   SpO2 94%   Opioid Risk Score:   Fall Risk Score:  `1  Depression screen PHQ 2/9  Depression screen PHQ 2/9 12/16/2016  Decreased Interest 1  Down, Depressed, Hopeless 1  PHQ - 2 Score 2    Altered sleeping 2  Tired, decreased energy 1  Change in appetite 2  Feeling bad or failure about yourself  0  Trouble concentrating 1  Moving slowly or fidgety/restless 2  Suicidal thoughts 0  PHQ-9 Score 10  Difficult doing work/chores Somewhat difficult    Review of Systems  Constitutional: Negative.   HENT: Negative.   Eyes: Negative.   Respiratory: Negative.   Cardiovascular: Negative.   Gastrointestinal: Positive for nausea.  Endocrine: Negative.   Genitourinary: Negative.   Musculoskeletal: Positive for arthralgias, back pain, myalgias and neck pain.  Allergic/Immunologic: Negative.   Neurological: Positive for headaches.  Hematological: Negative.   Psychiatric/Behavioral: The patient is nervous/anxious.   All other systems reviewed and are negative.      Objective:   Physical Exam Motor strength is 4/5 in the left deltoid, biceps, triceps, grip, finger flexors and extensors, pain inhibition. There is no hypersensitivity to touch in the left upper extremity. There is mild swelling of the left hand, both dorsal and palmar surface. There is no isolated joint swelling. Cervical range of motion is reduced to three-quarter range in flexion, extension, lateral bending and rotation, pain inhibition. Sensation is reported as reduced on the left side and C5 C6 C7, C8 distribution. Deep tendon reflexes are normal. Bilateral biceps, triceps, brachioradialis.         Assessment & Plan:  1. Chronic neck and left upper extremity pain. Her neuro exam has decreased sensation in multiple nerve root distribution. The left side. Her strength is against resistance, although inhibited by pain in the left upper extremity. Most recent MRI showing moderate C5-C6 foraminal stenosis. He has not responded well to treatments targeted at myofascial pain. We'll make referral to anesthesia pain management for left C5-C6 transforaminal cervical epidural steroid injection under fluoroscopic  guidance. If this is helpful, Dr. Maryjean Ka and his associates may assume care Patient is not seeking narcotics and has multiple medication sensitivities. I will see her back on an as-needed basis

## 2017-06-08 ENCOUNTER — Telehealth: Payer: Self-pay | Admitting: Neurology

## 2017-07-13 ENCOUNTER — Encounter: Payer: Self-pay | Admitting: Physical Medicine & Rehabilitation

## 2017-07-13 ENCOUNTER — Encounter: Payer: Medicare Other | Attending: Physical Medicine & Rehabilitation

## 2017-07-13 ENCOUNTER — Other Ambulatory Visit: Payer: Self-pay

## 2017-07-13 ENCOUNTER — Ambulatory Visit (HOSPITAL_BASED_OUTPATIENT_CLINIC_OR_DEPARTMENT_OTHER): Payer: Medicare Other | Admitting: Physical Medicine & Rehabilitation

## 2017-07-13 VITALS — BP 124/85 | HR 92

## 2017-07-13 DIAGNOSIS — J45909 Unspecified asthma, uncomplicated: Secondary | ICD-10-CM | POA: Diagnosis not present

## 2017-07-13 DIAGNOSIS — G8929 Other chronic pain: Secondary | ICD-10-CM | POA: Insufficient documentation

## 2017-07-13 DIAGNOSIS — M7918 Myalgia, other site: Secondary | ICD-10-CM | POA: Diagnosis not present

## 2017-07-13 DIAGNOSIS — M25512 Pain in left shoulder: Secondary | ICD-10-CM

## 2017-07-13 DIAGNOSIS — Z8249 Family history of ischemic heart disease and other diseases of the circulatory system: Secondary | ICD-10-CM | POA: Diagnosis not present

## 2017-07-13 DIAGNOSIS — G894 Chronic pain syndrome: Secondary | ICD-10-CM

## 2017-07-13 NOTE — Progress Notes (Signed)
Subjective:    Patient ID: Linda Munoz, female    DOB: [birth year], 56 y.o.   MRN: [id number]  HPI  56 year old female with chronic cervical pain left upper trapezius.  She has some pain in the shoulder as well.  She has undergone workup with multiple physicians.  Symptoms started with left retromastoid cerebellar resection performed about 21 years ago.  She is undergone recent neurological evaluation MRI of the brain ordered by Dr. Tomi Likens demonstrated the prior surgical changes but no new issues. She was referred for left C5-C6 transforaminal epidural steroid injection, Dr. Maryjean Ka, last month, patient reports no significant pain relief.  Pain Inventory Average Pain 7 Pain Right Now 10 My pain is burning and aching  In the last 24 hours, has pain interfered with the following? General activity 8 Relation with others 10 Enjoyment of life 8 What TIME of day is your pain at its worst? daytime Sleep (in general) Fair  Pain is worse with: walking Pain improves with: . Relief from Meds: 1  Mobility walk without assistance how many minutes can you walk? 15 ability to climb steps?  yes do you drive?  yes Do you have any goals in this area?  yes  Function disabled: date disabled 2012 I need assistance with the following:  .  Neuro/Psych bladder control problems depression anxiety  Prior Studies Any changes since last visit?  no  Physicians involved in your care Any changes since last visit?  no   Family History  Problem Relation Age of Onset  . Hypertension Sister    Social History   Socioeconomic History  . Marital status: Single    Spouse name: Not on file  . Number of children: 1  . Years of education: 53  . Highest education level: Not on file  Social Needs  . Financial resource strain: Not on file  . Food insecurity - worry: Not on file  . Food insecurity - inability: Not on file  . Transportation needs - medical: Not on file  . Transportation needs -  non-medical: Not on file  Occupational History  . Not on file  Tobacco Use  . Smoking status: Never Smoker  . Smokeless tobacco: Never Used  Substance and Sexual Activity  . Alcohol use: No    Alcohol/week: 0.0 oz  . Drug use: No  . Sexual activity: No  Other Topics Concern  . Not on file  Social History Narrative  . Not on file   Past Surgical History:  Procedure Laterality Date  . BRAIN SURGERY  1997   tumor 1997  . BREAST REDUCTION SURGERY Bilateral   . TUBAL LIGATION     Past Medical History:  Diagnosis Date  . Asthma   . Headache    There were no vitals taken for this visit.  Opioid Risk Score:   Fall Risk Score:  `1  Depression screen PHQ 2/9  Depression screen [facility] 2/9 07/13/2017 12/16/2016  Decreased Interest 1 1  Down, Depressed, Hopeless 1 1  PHQ - 2 Score 2 2  Altered sleeping - 2  Tired, decreased energy - 1  Change in appetite - 2  Feeling bad or failure about yourself  - 0  Trouble concentrating - 1  Moving slowly or fidgety/restless - 2  Suicidal thoughts - 0  PHQ-9 Score - 10  Difficult doing work/chores - Somewhat difficult     Review of Systems     Objective:   Physical Exam  Constitutional: She is oriented  to person, place, and time. She appears well-developed and well-nourished.  HENT:  Head: Normocephalic and atraumatic.  Eyes: Conjunctivae are normal. Pupils are equal, round, and reactive to light.  Neck: Normal range of motion.  Neurological: She is alert and oriented to person, place, and time.  Psychiatric: She has a normal mood and affect.  Nursing note and vitals reviewed.   Left shoulder some pain with external rotation negative impingement sign no pain over the [facility] joint, negative O'Brien's Neck has full range of motion negative Spurling's Sensation intact left upper extremity Strength is 5/5 in the right deltoid bicep tricep grip 4/5 in left deltoid bicep tricep grip, pain inhibition Gait is normal      Assessment &  Plan:  1.  Chronic left trapezius pain.  She has tried numerous sessions of physical therapy, acupuncture, heat, muscle creams, oral medications Unable to tolerate opioids including tramadol secondary to itching Did not respond to transforaminal injection. We discussed that she has exhausted treatment options of last 21 years. We will look into her shoulder as a potential pain generator however I do not think that this explains all of her pain in the left upper quadrant region. Will order shoulder x-ray, may do shoulder intra-articular injection 1 month. Consider cervical medial branch blocks. Do not think Botox would be indicated does not appear to be a dystonia

## 2017-08-07 ENCOUNTER — Ambulatory Visit: Payer: Medicare Other

## 2017-08-07 ENCOUNTER — Ambulatory Visit: Payer: Medicare Other | Admitting: Physical Medicine & Rehabilitation

## 2017-08-19 ENCOUNTER — Ambulatory Visit
Admission: RE | Admit: 2017-08-19 | Discharge: 2017-08-19 | Disposition: A | Discharge status: Home / Self Care | Payer: Medicare Other | Source: Ambulatory Visit | Attending: Physical Medicine & Rehabilitation | Admitting: Physical Medicine & Rehabilitation

## 2017-08-19 DIAGNOSIS — G8929 Other chronic pain: Secondary | ICD-10-CM

## 2017-08-19 DIAGNOSIS — M25512 Pain in left shoulder: Principal | ICD-10-CM

## 2017-08-27 ENCOUNTER — Ambulatory Visit (HOSPITAL_BASED_OUTPATIENT_CLINIC_OR_DEPARTMENT_OTHER): Payer: Medicare Other | Admitting: Physical Medicine & Rehabilitation

## 2017-08-27 ENCOUNTER — Encounter: Payer: Medicare Other | Attending: Physical Medicine & Rehabilitation

## 2017-08-27 ENCOUNTER — Encounter: Payer: Self-pay | Admitting: Physical Medicine & Rehabilitation

## 2017-08-27 VITALS — BP 127/90 | HR 77

## 2017-08-27 DIAGNOSIS — J45909 Unspecified asthma, uncomplicated: Secondary | ICD-10-CM | POA: Insufficient documentation

## 2017-08-27 DIAGNOSIS — M25512 Pain in left shoulder: Secondary | ICD-10-CM

## 2017-08-27 DIAGNOSIS — G8929 Other chronic pain: Secondary | ICD-10-CM | POA: Diagnosis not present

## 2017-08-27 DIAGNOSIS — Z8249 Family history of ischemic heart disease and other diseases of the circulatory system: Secondary | ICD-10-CM | POA: Insufficient documentation

## 2017-08-27 DIAGNOSIS — M19012 Primary osteoarthritis, left shoulder: Secondary | ICD-10-CM | POA: Diagnosis not present

## 2017-08-27 MED ORDER — CARISOPRODOL 350 MG PO TABS: 350.0000 mg | ORAL_TABLET | Freq: Every day | ORAL | 1 refills | Status: DC

## 2017-08-27 NOTE — Progress Notes (Signed)
Subjective:    Patient ID: Linda Munoz, female    DOB: 10-21-1960, 57 y.o.   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 General activity 5 Relation with others 4 Enjoyment of life 4 What TIME of day is your pain at its worst? all Sleep (in general) Fair  Pain is worse with: some activites Pain improves with: . Relief from Meds: .  Mobility walk without assistance ability to climb steps?  yes do you drive?  yes  Function disabled: date disabled .  Neuro/Psych bladder control problems depression  Prior Studies Any changes since last visit?  no  Physicians involved in your care Any changes since last visit?  no   Family History  Problem Relation Age of Onset  . Hypertension Sister    Social History   Socioeconomic History  . Marital status: Single    Spouse name: Not on file  . Number of children: 1  . Years of education: 48  . Highest education level: Not on file  Social Needs  . Financial resource strain: Not on file  . Food insecurity - worry: Not on file  . Food insecurity - inability: Not on file  . Transportation needs - medical: Not on file  . Transportation needs - non-medical: Not on file  Occupational History  . Not on file  Tobacco Use  . Smoking status: Never Smoker  . Smokeless tobacco: Never Used  Substance and Sexual Activity  . Alcohol use: No    Alcohol/week: 0.0 oz  . Drug use: No  . Sexual activity: No  Other Topics Concern  . Not on file  Social History Narrative  . Not on file   Past Surgical History:  Procedure Laterality Date  . BRAIN SURGERY  1997   tumor 1997  . BREAST REDUCTION  SURGERY Bilateral   . TUBAL LIGATION     Past Medical History:  Diagnosis Date  . Asthma   . Headache    There were no vitals taken for this visit.  Opioid Risk Score:   Fall Risk Score:  `1  Depression screen PHQ 2/9  Depression screen [facility] 2/9 07/13/2017 12/16/2016  Decreased Interest 1 1  Down, Depressed, Hopeless 1 1  PHQ - 2 Score 2 2  Altered sleeping - 2  Tired, decreased energy - 1  Change in appetite - 2  Feeling bad or failure about yourself  - 0  Trouble concentrating - 1  Moving slowly or fidgety/restless - 2  Suicidal thoughts - 0  PHQ-9 Score - 10  Difficult doing work/chores - Somewhat difficult     Review of Systems  Constitutional: Positive for unexpected weight change.  HENT: Negative.   Eyes: Negative.   Respiratory: Negative.   Cardiovascular: Negative.   Gastrointestinal: Negative.   Endocrine: Negative.   Genitourinary: Negative.   Musculoskeletal: Positive for joint swelling and neck pain.  Skin: Negative.   Allergic/Immunologic: Negative.   Neurological: Negative.   Hematological: Negative.   Psychiatric/Behavioral: Negative.   All other systems reviewed and are negative.      Objective:   Physical Exam  Constitutional: She is  oriented to person, place, and time. She appears well-developed and well-nourished.  HENT:  Head: Normocephalic and atraumatic.  Eyes: Conjunctivae and EOM are normal. Pupils are equal, round, and reactive to light.  Neck:  Tender to palpation with increased tone Left trap  Neurological: She is alert and oriented to person, place, and time.  Psychiatric: She has a normal mood and affect.  Nursing note and vitals reviewed.   + impingement sign Left shoulder with abd and fwd flexion Mild pain with horz adduction Left shoulder Patient with decreased cervical spine range of motion she has 50% range turning her head to the left compared to the right side.  She has 75% cervical flexion extension. There is no  evidence of anterior Collis or retrocollis.  Mild left-sided tilt noted.      Assessment & Plan:  1.  Multifactorial chronic left shoulder pain.  She does have increased tone in the left trapezius may have increased activity in the levator as well as splint is but would need EMG to assess this. 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  Looking at her shoulder motion I suspect her glenohumeral arthritis may be more symptomatic and we will inject the left glenohumeral joint today. If little effect upon 4-week follow-up I would recommend Botox for spasmodic torticollis vs US guided AC joint injection  Shoulder injection Left Glenohumeral Without ultrasound guidance)  Indication:Left Shoulder pain not relieved by medication management and other conservative care.OA Left [place] Walk noted on Xray 07/2017  Informed consent was obtained after describing risks and benefits of the procedure with the patient, this includes bleeding, bruising, infection and medication side effects. The patient wishes to proceed and has given written consent. Patient was placed in a seated position. TheLeft shoulder was marked and prepped with betadine in the subacromial area. A 25-gauge 1-1/2 inch needle was inserted into the subacromial area. After negative draw back for blood, a solution containing 1 mL of 6 mg per ML betamethasone and 4 mL of 1% lidocaine was injected. A band aid was applied. The patient tolerated the procedure well. Post procedure instructions were given.

## 2017-08-27 NOTE — Patient Instructions (Signed)
Carisoprodol tablets What is this medicine? CARISOPRODOL (kar eye soe PROE dole) is a muscle relaxer. It is used to treat pain and stiffness in muscles caused by strains, sprains, or other injury. This medicine may be used for other purposes; ask your health care provider or pharmacist if you have questions. COMMON BRAND NAME(S): Lajuan Lines What should I tell my health care provider before I take this medicine? They need to know if you have any of these conditions: -drug abuse or addiction -kidney disease -liver disease -porphyria -an unusual or allergic reaction to carisoprodol, carbamate such as meprobamate, other medicines, foods, dyes, or preservatives -pregnant or trying to get pregnant -breast-feeding How should I use this medicine? Take this medicine by mouth. Swallow it with a full glass of water. Follow the directions on the prescription label. If it upsets your stomach, take it with food or milk. Do not take more medicine than you are told to take. Talk to your pediatrician regarding the use of this medicine in children. Special care may be needed. This medicine is not usually used in children younger than 12 years. Overdosage: If you think you have taken too much of this medicine contact a poison control center or emergency room at once. NOTE: This medicine is only for you. Do not share this medicine with others. What if I miss a dose? If you miss a dose, take it as soon as you can. If it is almost time for your next dose, take only that dose. Do not take double or extra doses. What may interact with this medicine? Do not take this medication with any of the following medicines: -narcotic medicines for cough This medicine may also interact with the following medications: -alcohol -antihistamines for allergy, cough and cold -certain medicines for anxiety or sleep -certain medicines for depression like amitriptyline, fluoxetine, sertraline -certain medicines for seizures like  phenobarbital, primidone -general anesthetics like halothane, isoflurane, methoxyflurane, propofol -local anesthetics like lidocaine, pramoxine, tetracaine -medicines that relax muscles for surgery -narcotic medicines for pain -phenothiazines like chlorpromazine, mesoridazine, prochlorperazine, thioridazine This list may not describe all possible interactions. Give your health care provider a list of all the medicines, herbs, non-prescription drugs, or dietary supplements you use. Also tell them if you smoke, drink alcohol, or use illegal drugs. Some items may interact with your medicine. What should I watch for while using this medicine? Tell your doctor or health care professional if your symptoms do not start to get better or if they get worse. You may get drowsy or dizzy. Do not drive, use machinery, or do anything that needs mental alertness until you know how this medicine affects you. Do not stand or sit up quickly, especially if you are an older patient. This reduces the risk of dizzy or fainting spells. Alcohol may interfere with the effect of this medicine. Avoid alcoholic drinks. If you are taking another medicine that also causes drowsiness, you may have more side effects. Give your health care provider a list of all medicines you use. Your doctor will tell you how much medicine to take. Do not take more medicine than directed. Call emergency for help if you have problems breathing or unusual sleepiness. What side effects may I notice from receiving this medicine? Side effects that you should report to your doctor or health care professional as soon as possible: -allergic reactions like skin rash, itching or hives, swelling of the face, lips, or tongue -breathing problems -confusion -feeling faint or lightheaded, falls -unusually weak or  tired Side effects that usually do not require medical attention (report to your doctor or health care professional if they continue or are  bothersome): -headache -nausea, vomiting This list may not describe all possible side effects. Call your doctor for medical advice about side effects. You may report side effects to FDA at 1-800-FDA-1088. Where should I keep my medicine? Keep out of the reach of children. This medicine may cause accidental overdose and death if it taken by other adults, children, or pets. Mix any unused medicine with a substance like cat litter or coffee grounds. Then throw the medicine away in a sealed container like a sealed bag or a coffee can with a lid. Do not use the medicine after the expiration date. Store at room temperature between 15 and 30 degrees C (59 and 86 degrees F). NOTE: This sheet is a summary. It may not cover all possible information. If you have questions about this medicine, talk to your doctor, pharmacist, or health care provider.  2018 [employer]vier/Gold Standard (2015-05-21 14:16:06)

## 2017-09-16 ENCOUNTER — Other Ambulatory Visit: Payer: Self-pay | Admitting: Family Medicine

## 2017-09-16 DIAGNOSIS — Z1231 Encounter for screening mammogram for malignant neoplasm of breast: Secondary | ICD-10-CM

## 2017-09-28 ENCOUNTER — Encounter: Payer: Medicare Other | Attending: Physical Medicine & Rehabilitation

## 2017-09-28 ENCOUNTER — Ambulatory Visit (HOSPITAL_BASED_OUTPATIENT_CLINIC_OR_DEPARTMENT_OTHER): Payer: Medicare Other | Admitting: Physical Medicine & Rehabilitation

## 2017-09-28 ENCOUNTER — Encounter: Payer: Self-pay | Admitting: Physical Medicine & Rehabilitation

## 2017-09-28 VITALS — BP 128/90 | HR 76 | Resp 14

## 2017-09-28 DIAGNOSIS — Z8249 Family history of ischemic heart disease and other diseases of the circulatory system: Secondary | ICD-10-CM | POA: Diagnosis not present

## 2017-09-28 DIAGNOSIS — G243 Spasmodic torticollis: Secondary | ICD-10-CM | POA: Diagnosis not present

## 2017-09-28 DIAGNOSIS — G8929 Other chronic pain: Secondary | ICD-10-CM | POA: Insufficient documentation

## 2017-09-28 DIAGNOSIS — J45909 Unspecified asthma, uncomplicated: Secondary | ICD-10-CM | POA: Diagnosis not present

## 2017-09-28 DIAGNOSIS — Z91012 Allergy to eggs: Secondary | ICD-10-CM | POA: Diagnosis not present

## 2017-09-28 DIAGNOSIS — M25512 Pain in left shoulder: Secondary | ICD-10-CM | POA: Diagnosis not present

## 2017-09-28 NOTE — Patient Instructions (Signed)
Cervical Dystonia with plans for Botox

## 2017-09-28 NOTE — Progress Notes (Signed)
Subjective:    Patient ID: Linda Munoz, female    DOB: 04-Feb-1961, 57 y.o.   MRN: [id number]  HPI 57 year old female with history of neck pain which originated about 22 years ago following retromastoid left cerebellar resection for tumor.  She has tried multiple previous treatments including physical therapy nonsteroidal anti-inflammatories, Lyrica, gabapentin, tramadol, hydrocodone, tizanidine and Cymbalta.  She has had neurological evaluation, MRI of the head did not show any new structural abnormalities.  For headache prophylaxis she has had topiramate as well as Pamelor, for abortive therapy Maxalt.  Primary care has started Lexapro and Seroquel  Since initial evaluation at this clinic on 12/16/2016 has undergone left subacromial bursa injection without results, trigger point injection in the left levator scapula left infraspinatus with 2 days of improvement, referral to physical therapy for dry needling C-spine done MRI demonstrated mild degenerative changes at C5-C6 with mild to moderate foraminal stenosis, underwent transforaminal epidural by Dr. Maryjean Ka at left C5-C6 without significant pain relief. Left glenohumeral injection performed 08/27/2016 without significant relief. Pain Inventory Average Pain 6 Pain Right Now 6 My pain is burning  In the last 24 hours, has pain interfered with the following? General activity 4 Relation with others 6 Enjoyment of life 6 What TIME of day is your pain at its worst? daytime Sleep (in general) Fair  Pain is worse with: some activites Pain improves with: nothing Relief from Meds: 0  Mobility walk without assistance how many minutes can you walk? 20 ability to climb steps?  yes do you drive?  yes  Function not employed: date last employed . disabled: date disabled . Do you have any goals in this area?  yes  Neuro/Psych bladder control problems depression anxiety  Prior Studies Any changes since last visit?  no  Physicians  involved in your care Any changes since last visit?  no   Family History  Problem Relation Age of Onset  . Hypertension Sister    Social History   Socioeconomic History  . Marital status: Single    Spouse name: None  . Number of children: 1  . Years of education: 69  . Highest education level: None  Social Needs  . Financial resource strain: None  . Food insecurity - worry: None  . Food insecurity - inability: None  . Transportation needs - medical: None  . Transportation needs - non-medical: None  Occupational History  . None  Tobacco Use  . Smoking status: Never Smoker  . Smokeless tobacco: Never Used  Substance and Sexual Activity  . Alcohol use: No    Alcohol/week: 0.0 oz  . Drug use: No  . Sexual activity: No  Other Topics Concern  . None  Social History Narrative  . None   Past Surgical History:  Procedure Laterality Date  . BRAIN SURGERY  1997   tumor 1997  . BREAST REDUCTION SURGERY Bilateral   . TUBAL LIGATION     Past Medical History:  Diagnosis Date  . Asthma   . Headache    BP 128/90 (BP Location: Left Arm, Patient Position: Sitting, Cuff Size: Normal)   Pulse 76   Resp 14   SpO2 99%   Opioid Risk Score:   Fall Risk Score:  `1  Depression screen PHQ 2/9  Depression screen [facility] 2/9 07/13/2017 12/16/2016  Decreased Interest 1 1  Down, Depressed, Hopeless 1 1  PHQ - 2 Score 2 2  Altered sleeping - 2  Tired, decreased energy - 1  Change in  appetite - 2  Feeling bad or failure about yourself  - 0  Trouble concentrating - 1  Moving slowly or fidgety/restless - 2  Suicidal thoughts - 0  PHQ-9 Score - 10  Difficult doing work/chores - Somewhat difficult    Review of Systems  Constitutional: Negative.   HENT: Negative.   Eyes: Negative.   Respiratory: Negative.   Cardiovascular: Negative.   Gastrointestinal: Negative.   Endocrine: Negative.   Genitourinary: Negative.   Musculoskeletal: Positive for arthralgias, back pain, myalgias  and neck pain.  Skin: Negative.   Allergic/Immunologic: Negative.   Neurological: Positive for headaches.  Hematological: Negative.   Psychiatric/Behavioral: Positive for dysphoric mood. The patient is nervous/anxious.        Objective:   Physical Exam  Constitutional: She is oriented to person, place, and time. She appears well-developed and well-nourished. No distress.  HENT:  Head: Normocephalic and atraumatic.  Eyes: Conjunctivae and EOM are normal. Pupils are equal, round, and reactive to light.  Neck: Normal range of motion.  Neurological: She is alert and oriented to person, place, and time.  Skin: She is not diaphoretic.  Psychiatric: She has a normal mood and affect.  Nursing note and vitals reviewed.  General no acute distress Right lateral Collis mild torticollis to the right no evidence of retro-or Earlie Counts. Negative impingement sign left shoulder  Left SCM, tender Left levator, tender Left splenius capitis, tender Right splenius cap, tender     Assessment & Plan:  1.  Chronic neck pain does have abnormal neck positioning.  Patient does not seem to be aware of this. She has a right lateral Collis and mild torticollis to the right. As discussed with patient, diagnosis of cervical dystonia, recommend Botox to the following muscle groups Left splenius capitis 25 units Right splenius capitis 25 units Right levator scapula 25 units Left sternocleidomastoid 25 units  As discussed with patient may need to adjust her dosing upwards.  Patient gives a history of some itching after eating eggs the last few months.  Was able to eat eggs for many many years prior to that.  She does have multiple drug allergies. Would like her to see an allergist to see if she does have albumin allergy prior to injecting Botox

## 2017-10-06 ENCOUNTER — Ambulatory Visit
Admission: RE | Admit: 2017-10-06 | Discharge: 2017-10-06 | Disposition: A | Discharge status: Home / Self Care | Payer: Medicare Other | Source: Ambulatory Visit | Attending: Family Medicine | Admitting: Family Medicine

## 2017-10-06 DIAGNOSIS — Z1231 Encounter for screening mammogram for malignant neoplasm of breast: Secondary | ICD-10-CM

## 2017-10-07 ENCOUNTER — Other Ambulatory Visit: Payer: Self-pay | Admitting: Family Medicine

## 2017-10-07 DIAGNOSIS — R928 Other abnormal and inconclusive findings on diagnostic imaging of breast: Secondary | ICD-10-CM

## 2017-10-13 ENCOUNTER — Ambulatory Visit
Admission: RE | Admit: 2017-10-13 | Discharge: 2017-10-13 | Disposition: A | Discharge status: Home / Self Care | Payer: Medicare Other | Source: Ambulatory Visit | Attending: Family Medicine | Admitting: Family Medicine

## 2017-10-13 DIAGNOSIS — R928 Other abnormal and inconclusive findings on diagnostic imaging of breast: Secondary | ICD-10-CM

## 2017-10-27 ENCOUNTER — Encounter: Payer: Self-pay | Admitting: Allergy and Immunology

## 2017-10-28 NOTE — Telephone Encounter (Signed)
error 

## 2017-11-23 ENCOUNTER — Ambulatory Visit (INDEPENDENT_AMBULATORY_CARE_PROVIDER_SITE_OTHER): Payer: Medicare Other | Admitting: Pediatrics

## 2017-11-23 ENCOUNTER — Encounter: Payer: Self-pay | Admitting: Pediatrics

## 2017-11-23 VITALS — BP 126/80 | HR 72 | Temp 97.8°F | Resp 16 | Ht 59.84 in | Wt 144.0 lb

## 2017-11-23 DIAGNOSIS — J4530 Mild persistent asthma, uncomplicated: Secondary | ICD-10-CM | POA: Diagnosis not present

## 2017-11-23 DIAGNOSIS — T7800XD Anaphylactic reaction due to unspecified food, subsequent encounter: Secondary | ICD-10-CM | POA: Diagnosis not present

## 2017-11-23 DIAGNOSIS — T7800XA Anaphylactic reaction due to unspecified food, initial encounter: Secondary | ICD-10-CM | POA: Insufficient documentation

## 2017-11-23 DIAGNOSIS — J3089 Other allergic rhinitis: Secondary | ICD-10-CM | POA: Insufficient documentation

## 2017-11-23 DIAGNOSIS — Z87898 Personal history of other specified conditions: Secondary | ICD-10-CM

## 2017-11-23 DIAGNOSIS — R9431 Abnormal electrocardiogram [ECG] [EKG]: Secondary | ICD-10-CM

## 2017-11-23 DIAGNOSIS — K219 Gastro-esophageal reflux disease without esophagitis: Secondary | ICD-10-CM | POA: Insufficient documentation

## 2017-11-23 MED ORDER — MONTELUKAST SODIUM 10 MG PO TABS: ORAL_TABLET | ORAL | 5 refills | Status: AC

## 2017-11-23 MED ORDER — FLUTICASONE PROPIONATE 50 MCG/ACT NA SUSP: NASAL | 5 refills | Status: DC

## 2017-11-23 MED ORDER — EPINEPHRINE 0.3 MG/0.3ML IJ SOAJ: INTRAMUSCULAR | 1 refills | Status: AC

## 2017-11-23 MED ORDER — ALBUTEROL SULFATE HFA 108 (90 BASE) MCG/ACT IN AERS: 2.0000 | INHALATION_SPRAY | Freq: Four times a day (QID) | RESPIRATORY_TRACT | 1 refills | Status: DC | PRN

## 2017-11-23 MED ORDER — CETIRIZINE HCL 10 MG PO TABS: ORAL_TABLET | ORAL | 5 refills | Status: DC

## 2017-11-23 NOTE — Patient Instructions (Addendum)
Environmental control of dust and mold Cetirizine 10 mg-take 1 tablet once a day if needed for runny nose or itching Fluticasone 2 sprays per nostril once a day for stuffy nose Pro-air 2 puffs every 4 hours if needed for wheezing or coughing spells Montelukast  10 mg-take 1 tablet once a day to prevent coughing or wheezing Do foods with salicylates make you itch? Avoid red meats,hot  peppers, onions, tomato and cheese and egg. If you have an allergic reaction take Benadryl 50 mg every 4 hours and if you have life-threatening symptoms inject with EpiPen 0.3 mg Continue on your other medications Call me if you're not doing well on this treatment plan Left me know the results of your  cardiology consult I will see you when you return from [place] in about 3 months but call me if you're not doing well before year ago Your  itching from foods may be because of the salicylates in the foods or a yellow artificial coloring called Tartrazine

## 2017-11-23 NOTE — Progress Notes (Signed)
[place] [address] Dept: [phone]  New Patient Note  Patient ID: Linda Munoz, female    DOB: Jan 16, 1961  Age: 57 y.o. MRN: [id number] Date of Office Visit: 11/23/2017 Referring provider: Charlett Blake, [place], [place] [address]    Chief Complaint: Allergic Rhinitis  (all year round); Asthma (doing good right now); and Food Intolerance (cheese,hot peppers, onions,tomatoes,red meats get red all over and starts itching.)  HPI Linda Munoz presents for evaluation of asthma, nasal congestion and possible food allergies .She has been using her Pro-air inhaler about 3 times per week. .She has had asthma for 34 years. She has had nasal congestion for 7 or 8 years. She has noted itching of her skin within a few minutes of eating red meats, onions, tomato, cheese and hot peppers and more recently eggs ... She has gastroesophageal reflux. She has never had pneumonia. She has not had eczema or chronic hives in the past. She has not had any tick bites. She had some chest pain  a few weeeeks ago and an EKG which  was abnormal. She is scheduled to see a cardiologist soon .She is scheduled to go to [place] in a couple of weeks and will be back in 3 months  Review of Systems  Constitutional: Negative.   HENT:       Perennial nasal congestion for 7 or 8 years  Eyes:       Previously, possible pre glaucoma  Respiratory:       Asthma for 34 years  Cardiovascular:       Chest pain a few weeks ago with an abnormal EKG. She has an appointment with a cardiologist in May  Gastrointestinal:       Gastroesophageal reflux  Genitourinary:       Breast reduction surgery  Musculoskeletal:       Arthritis of the lower back  Skin:       Itchy skin from red meat, onions, tomato and cheese. Precancerous skin lesions. Rosacea  Neurological:       Migraine headaches.. History of a left-sided brain tumor removed  Endo/Heme/Allergies:       No diabetes or  thyroid disease  Psychiatric/Behavioral: Negative.     Outpatient Encounter Medications as of 11/23/2017  Medication Sig  . albuterol (PROVENTIL) (2.5 MG/3ML) 0.083% nebulizer solution Two (2) times a day.  Marland Kitchen aspirin 81 MG chewable tablet Chew by mouth daily.  . AZELEX 20 % cream   . cetirizine (ZYRTEC) 10 MG tablet Take 10 mg by mouth daily.  . cyclobenzaprine (FLEXERIL) 5 MG tablet Take 1 tablet (5 mg total) by mouth 3 (three) times daily as needed for muscle spasms.  Marland Kitchen escitalopram (LEXAPRO) 10 MG tablet   . fluocinonide ointment (LIDEX) 0.05 % AAA BID  . FLUoxetine (PROZAC) 20 MG capsule TK 1 C PO QAM  . Multiple Vitamins-Minerals (MULTIVITAMIN WITH MINERALS) tablet Take 1 tablet by mouth daily.  Marland Kitchen MYRBETRIQ 25 MG TB24 tablet TK 1 T PO QD  . omega-3 acid ethyl esters (LOVAZA) 1 g capsule Take by mouth 2 (two) times daily.  Marland Kitchen PROAIR HFA 108 (90 BASE) MCG/ACT inhaler   . QUEtiapine (SEROQUEL) 25 MG tablet Take 1 or 2 tab by mouth nightly  . ranitidine (ZANTAC) 150 MG tablet TK 1 T PO  BID  . SUMAtriptan (IMITREX) 50 MG tablet Take 1tab at earliest onset of headache.  May repeat x1 in 2 hours if headache persists  or recurs.  Marland Kitchen albuterol (PROAIR HFA) 108 (90 Base) MCG/ACT inhaler Inhale 2 puffs into the lungs every 6 (six) hours as needed for wheezing or shortness of breath.  . cetirizine (ZYRTEC) 10 MG tablet Take 1 tablet once a day if needed for runny nose or itching.  Marland Kitchen EPINEPHrine 0.3 mg/0.3 mL IJ SOAJ injection Use as directed for severe allergic reactions  . fluticasone (FLONASE) 50 MCG/ACT nasal spray 2 sprays per nostril once a day for stuffy nose.  . montelukast (SINGULAIR) 10 MG tablet Take 1 tablet once a day to prevent coughing or wheezing.  . [DISCONTINUED] albuterol (PROVENTIL HFA;VENTOLIN HFA) 108 (90 Base) MCG/ACT inhaler Inhale into the lungs.  . [DISCONTINUED] ranitidine (ZANTAC) 150 MG capsule TAKE 1 CAPSULE BY MOUTH TWICE DAILY FOR REFLUX   No facility-administered  encounter medications on file as of 11/23/2017.      Drug Allergies:  Allergies  Allergen Reactions  . Carisoprodol Hives  . Norflex [Orphenadrine Citrate]   . Sulfa Antibiotics   . Sulfacetamide Sodium   . Duloxetine Rash  . Latex Itching  . Oxycodone-Acetaminophen Itching  . Tramadol Itching    Family History: Kanon's family history includes Allergic rhinitis in her brother and sister; Asthma in her son; Hypertension in her sister..there is no family history of eczema, hives, food allergies, chronic bronchitis or emphysema.  Social and environmental. There is a dog in the home. She is not exposed to cigarette smoking.She smoked cigarettes for about 5 years in the past. She is disabled.  Physical Exam: BP 126/80 (BP Location: Left Arm, Patient Position: Sitting, Cuff Size: Normal)   Pulse 72   Temp 97.8 F (36.6 C) (Oral)   Resp 16   Ht 4' 11.84" (1.52 m)   Wt 143 lb 15.4 oz (65.3 kg)   SpO2 97%   BMI 28.26 kg/m    Physical Exam  Constitutional: She is oriented to person, place, and time. She appears well-developed and well-nourished.  HENT:  Eyes normal. Ears normal. Nose mild swelling of nasal turbinates. Pharynx normal.  Neck: Neck supple. No thyromegaly present.  Cardiovascular:  S1 and S2 normal no murmurs  Pulmonary/Chest:  Clear to percussion and auscultation  Abdominal: Soft. There is no tenderness (no hepatosplenomegaly).  Lymphadenopathy:    She has no cervical adenopathy.  Neurological: She is alert and oriented to person, place, and time.  Skin:  Clear but she had dermographia  Psychiatric: She has a normal mood and affect. Her behavior is normal. Judgment and thought content normal.  Vitals reviewed.   Diagnostics: FVC 2.49 L FEV1 2.27 L. Predicted FVC 2.54 L predicted FEV1 2.05 L. After albuterol 2 puffs FVC 2.62 L FEV1 2.36 L-the spirometry is in the normal range and there was no significant improvement after albuterol  Allergy skin tests were  positive to molds on intradermal testing only. Skin testing to foods was negative    Assessment  Assessment and Plan: 1. Mild persistent asthma without complication   2. Other allergic rhinitis   3. Anaphylactic shock due to food, subsequent encounter   4. Gastroesophageal reflux disease without esophagitis   5. Abnormal EKG   6. History of brain tumor     Meds ordered this encounter  Medications  . EPINEPHrine 0.3 mg/0.3 mL IJ SOAJ injection    Sig: Use as directed for severe allergic reactions    Dispense:  2 Device    Refill:  1  . fluticasone (FLONASE) 50 MCG/ACT nasal spray  Sig: 2 sprays per nostril once a day for stuffy nose.    Dispense:  16 g    Refill:  5  . montelukast (SINGULAIR) 10 MG tablet    Sig: Take 1 tablet once a day to prevent coughing or wheezing.    Dispense:  34 tablet    Refill:  5  . albuterol (PROAIR HFA) 108 (90 Base) MCG/ACT inhaler    Sig: Inhale 2 puffs into the lungs every 6 (six) hours as needed for wheezing or shortness of breath.    Dispense:  1 Inhaler    Refill:  1    Please keep rx on file. Pt. Will call when needed.  . cetirizine (ZYRTEC) 10 MG tablet    Sig: Take 1 tablet once a day if needed for runny nose or itching.    Dispense:  34 tablet    Refill:  5    Patient Instructions  Environmental control of dust and mold Cetirizine 10 mg-take 1 tablet once a day if needed for runny nose or itching Fluticasone 2 sprays per nostril once a day for stuffy nose Pro-air 2 puffs every 4 hours if needed for wheezing or coughing spells Montelukast  10 mg-take 1 tablet once a day to prevent coughing or wheezing Do foods with salicylates make you itch? Avoid red meats,hot  peppers, onions, tomato and cheese and egg. If you have an allergic reaction take Benadryl 50 mg every 4 hours and if you have life-threatening symptoms inject with EpiPen 0.3 mg Continue on your other medications Call me if you're not doing well on this treatment  plan Left me know the results of your  cardiology consult I will see you when you return from [place] in about 3 months but call me if you're not doing well before year ago Your  itching from foods may be because of the salicylates in the foods or a yellow artificial coloring called Tartrazine     Return in about 3 months (around 02/22/2018).   Thank you for the opportunity to care for this patient.  Please do not hesitate to contact me with questions.  Penne Lash, M.D.  [facility] [address] [place], [place] [address] [phone]

## 2018-03-01 ENCOUNTER — Other Ambulatory Visit: Payer: Self-pay | Admitting: Allergy

## 2018-03-01 MED ORDER — FLUTICASONE PROPIONATE 50 MCG/ACT NA SUSP: NASAL | 5 refills | Status: AC

## 2018-03-28 IMAGING — CR DG SHOULDER 2+V*L*
2 series · 2 of 2 positions shown · non-contrast
Comparison: No recent prior.

CLINICAL DATA: Pain swelling.  No injury.

EXAM:
LEFT SHOULDER - 2+ VIEW

[w shoulder ap internal left]
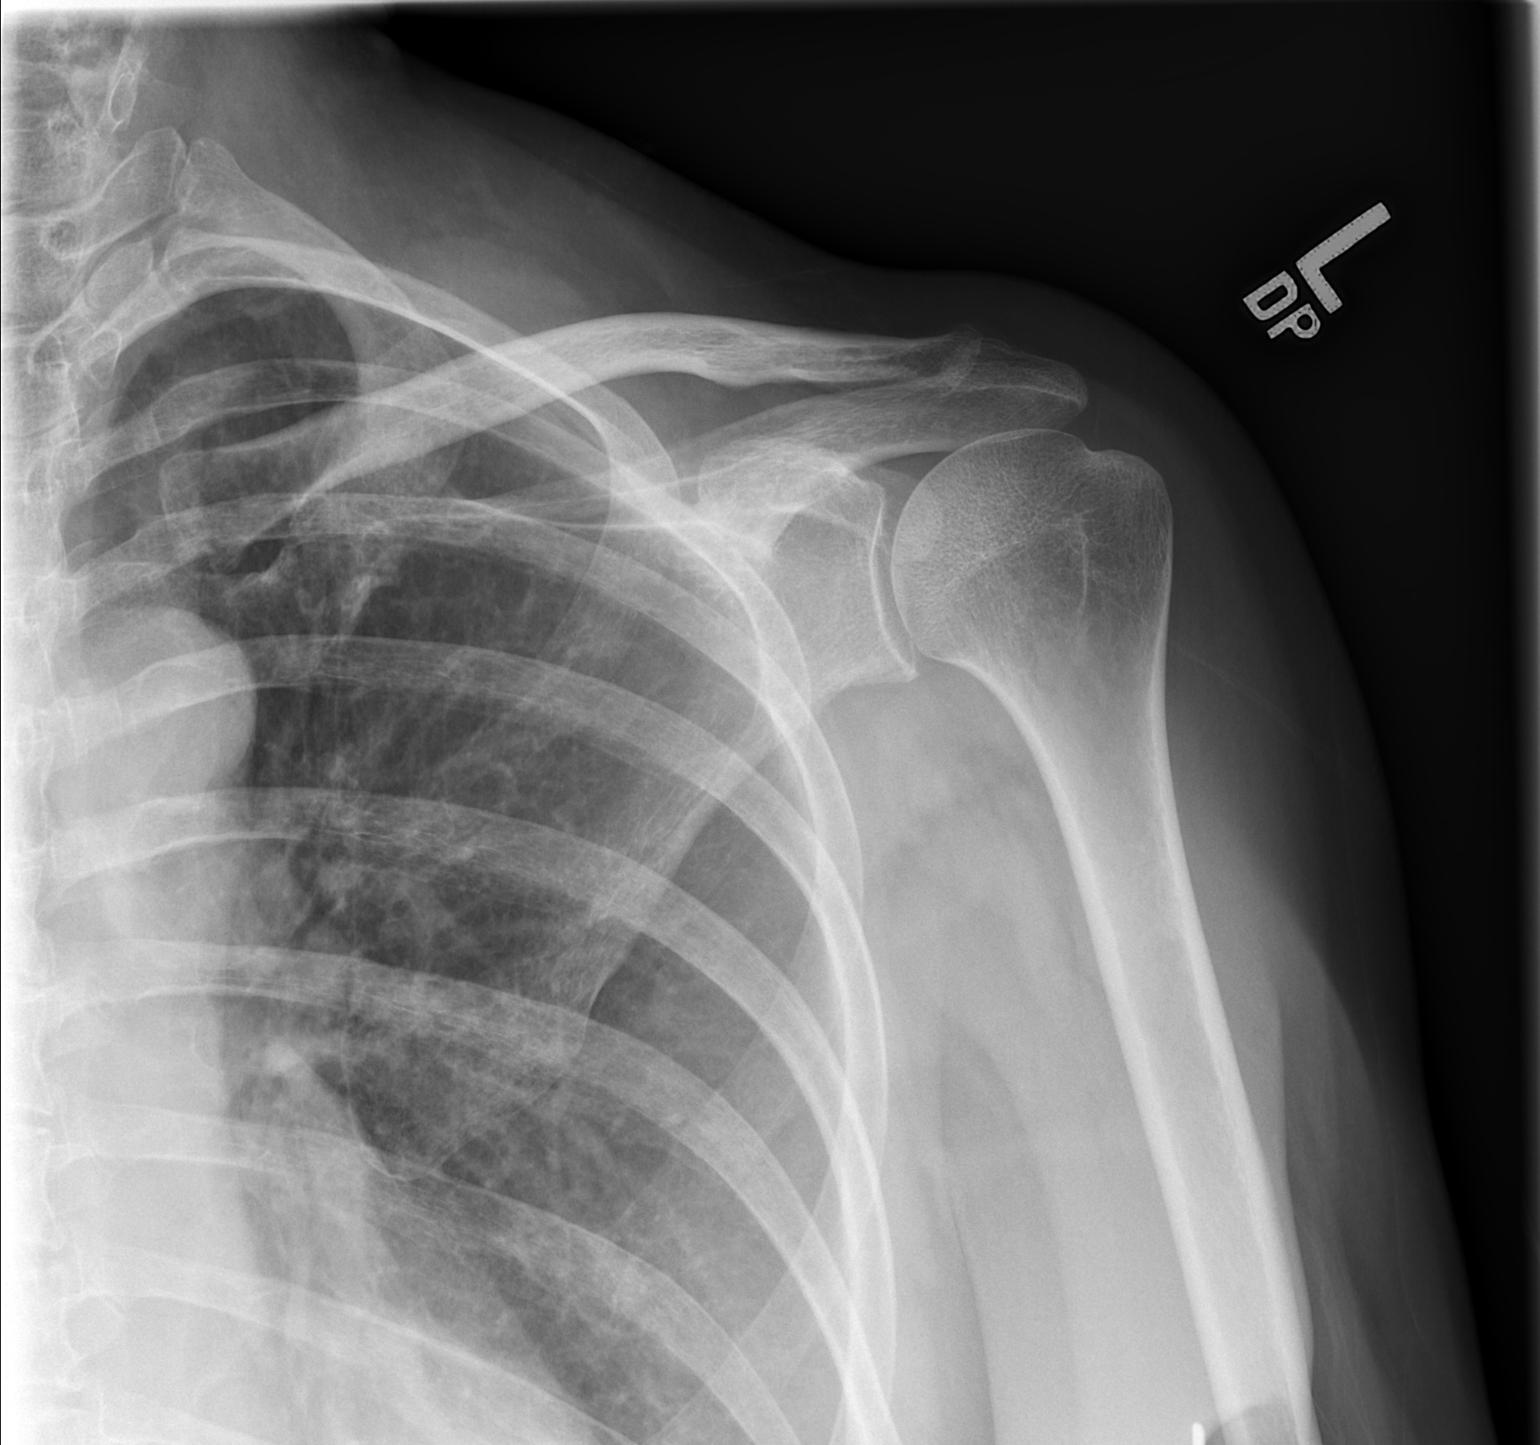

[w shoulder y view left]
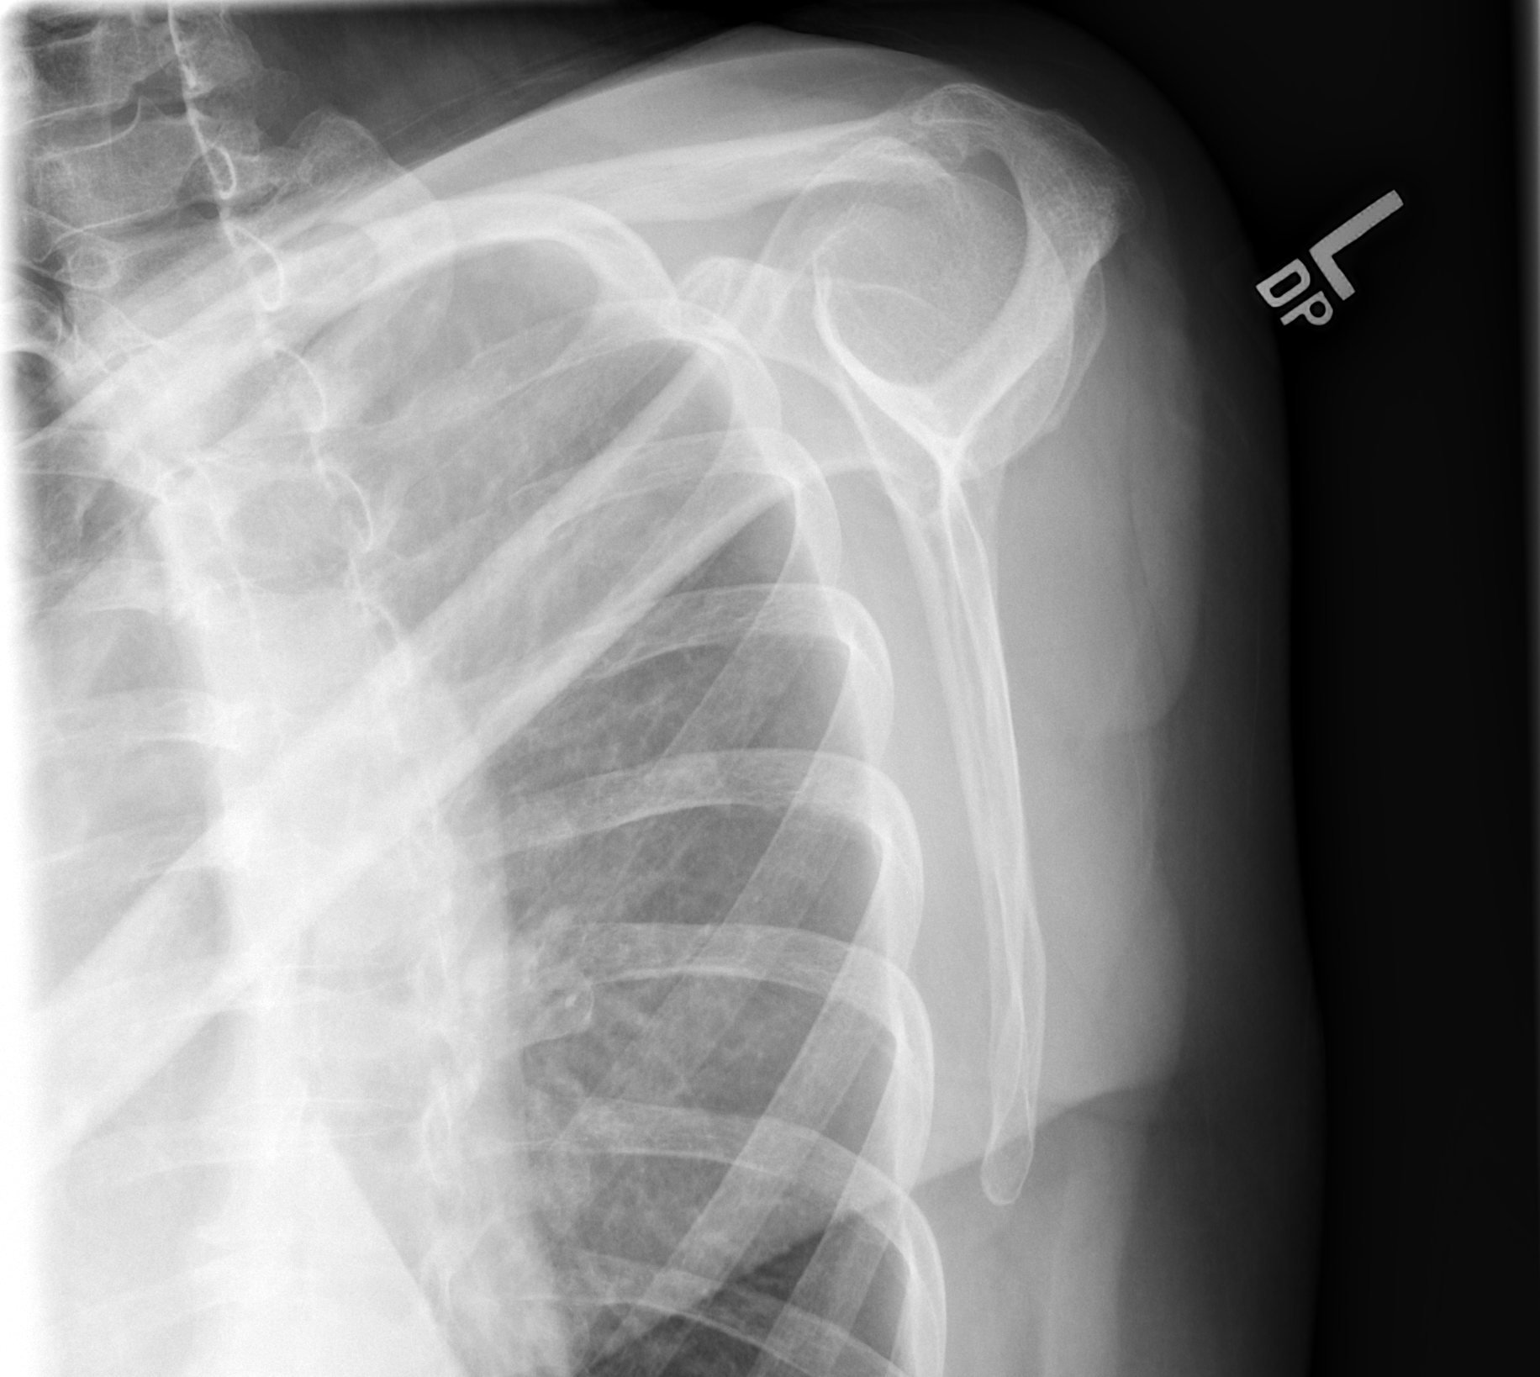

[2 of 2 positions shown; findings below may reference images not displayed]

FINDINGS: No acute bony or joint abnormality identified. No evidence of
fracture. Acromioclavicular glenohumeral degenerative change.
IMPRESSION: Acromioclavicular and glenohumeral degenerative change. No acute
bony abnormality.

## 2018-05-31 ENCOUNTER — Other Ambulatory Visit: Payer: Self-pay | Admitting: Allergy

## 2018-05-31 MED ORDER — MONTELUKAST SODIUM 10 MG PO TABS: 10.0000 mg | ORAL_TABLET | Freq: Every day | ORAL | 5 refills | Status: AC

## 2018-08-31 ENCOUNTER — Other Ambulatory Visit: Payer: Self-pay | Admitting: Family

## 2018-08-31 DIAGNOSIS — Z1231 Encounter for screening mammogram for malignant neoplasm of breast: Secondary | ICD-10-CM

## 2018-10-07 ENCOUNTER — Other Ambulatory Visit: Payer: Self-pay | Admitting: Family Medicine

## 2018-10-07 ENCOUNTER — Ambulatory Visit
Admission: RE | Admit: 2018-10-07 | Discharge: 2018-10-07 | Disposition: A | Discharge status: Home / Self Care | Payer: Medicare Other | Source: Ambulatory Visit | Attending: Family | Admitting: Family

## 2018-10-07 DIAGNOSIS — Z1231 Encounter for screening mammogram for malignant neoplasm of breast: Secondary | ICD-10-CM

## 2018-12-22 ENCOUNTER — Other Ambulatory Visit: Payer: Self-pay | Admitting: *Deleted

## 2018-12-22 MED ORDER — ALBUTEROL SULFATE HFA 108 (90 BASE) MCG/ACT IN AERS: 2.0000 | INHALATION_SPRAY | Freq: Four times a day (QID) | RESPIRATORY_TRACT | 0 refills | Status: AC | PRN

## 2018-12-22 NOTE — Telephone Encounter (Signed)
Courtesy refill of proair given. No additional refills until pt has ov.

## 2019-07-07 ENCOUNTER — Other Ambulatory Visit: Payer: Self-pay | Admitting: *Deleted

## 2019-10-28 ENCOUNTER — Other Ambulatory Visit: Payer: Self-pay | Admitting: Family Medicine

## 2019-10-28 ENCOUNTER — Ambulatory Visit
Admission: RE | Admit: 2019-10-28 | Discharge: 2019-10-28 | Disposition: A | Discharge status: Home / Self Care | Payer: Medicare Other | Source: Ambulatory Visit | Attending: Family Medicine | Admitting: Family Medicine

## 2019-10-28 ENCOUNTER — Other Ambulatory Visit: Payer: Self-pay

## 2019-10-28 DIAGNOSIS — Z1231 Encounter for screening mammogram for malignant neoplasm of breast: Secondary | ICD-10-CM

## 2020-10-03 ENCOUNTER — Other Ambulatory Visit: Payer: Self-pay | Admitting: Family Medicine

## 2020-10-03 DIAGNOSIS — Z1231 Encounter for screening mammogram for malignant neoplasm of breast: Secondary | ICD-10-CM

## 2020-11-22 ENCOUNTER — Other Ambulatory Visit: Payer: Self-pay

## 2020-11-22 ENCOUNTER — Ambulatory Visit
Admission: RE | Admit: 2020-11-22 | Discharge: 2020-11-22 | Disposition: A | Discharge status: Home / Self Care | Payer: Medicare Other | Source: Ambulatory Visit | Attending: Family Medicine | Admitting: Family Medicine

## 2020-11-22 DIAGNOSIS — Z1231 Encounter for screening mammogram for malignant neoplasm of breast: Secondary | ICD-10-CM

## 2021-10-17 ENCOUNTER — Other Ambulatory Visit: Payer: Self-pay | Admitting: Internal Medicine

## 2021-10-17 DIAGNOSIS — R2232 Localized swelling, mass and lump, left upper limb: Secondary | ICD-10-CM

## 2021-10-23 ENCOUNTER — Ambulatory Visit
Admission: RE | Admit: 2021-10-23 | Discharge: 2021-10-23 | Disposition: A | Discharge status: Home / Self Care | Payer: Medicare Other | Source: Ambulatory Visit | Attending: Internal Medicine | Admitting: Internal Medicine

## 2021-10-23 DIAGNOSIS — R2232 Localized swelling, mass and lump, left upper limb: Secondary | ICD-10-CM

## 2021-11-13 ENCOUNTER — Telehealth: Payer: Self-pay | Admitting: Family Medicine

## 2021-11-13 NOTE — Telephone Encounter (Signed)
Patient was referred for a Pap, I called but was unable to reach...Voicemail is not setup. ?

## 2021-12-09 ENCOUNTER — Other Ambulatory Visit: Payer: Self-pay | Admitting: Internal Medicine

## 2021-12-09 DIAGNOSIS — Z1231 Encounter for screening mammogram for malignant neoplasm of breast: Secondary | ICD-10-CM

## 2021-12-12 ENCOUNTER — Ambulatory Visit
Admission: RE | Admit: 2021-12-12 | Discharge: 2021-12-12 | Disposition: A | Discharge status: Home / Self Care | Payer: Medicare Other | Source: Ambulatory Visit | Attending: Internal Medicine | Admitting: Internal Medicine

## 2021-12-12 DIAGNOSIS — Z1231 Encounter for screening mammogram for malignant neoplasm of breast: Secondary | ICD-10-CM

## 2021-12-16 ENCOUNTER — Telehealth: Payer: Self-pay | Admitting: Family Medicine

## 2021-12-16 ENCOUNTER — Encounter: Payer: Self-pay | Admitting: Family Medicine

## 2021-12-16 NOTE — Telephone Encounter (Signed)
With the help of the interpreter, both phone numbers listed for the patient was called to inform her of a canceled appointment, there was no answer to the phone call so a letter was sent.  ?

## 2022-01-29 ENCOUNTER — Encounter: Payer: Medicare Other | Admitting: Obstetrics and Gynecology

## 2022-05-20 ENCOUNTER — Ambulatory Visit (INDEPENDENT_AMBULATORY_CARE_PROVIDER_SITE_OTHER): Payer: Medicare Other | Admitting: Medical

## 2022-05-20 ENCOUNTER — Encounter: Payer: Self-pay | Admitting: Medical

## 2022-05-20 ENCOUNTER — Other Ambulatory Visit (HOSPITAL_COMMUNITY)
Admission: RE | Admit: 2022-05-20 | Discharge: 2022-05-20 | Disposition: A | Discharge status: Home / Self Care | Payer: Medicare Other | Source: Ambulatory Visit | Attending: Obstetrics and Gynecology | Admitting: Obstetrics and Gynecology

## 2022-05-20 VITALS — BP 143/92 | HR 89 | Wt 145.4 lb

## 2022-05-20 DIAGNOSIS — Z1151 Encounter for screening for human papillomavirus (HPV): Secondary | ICD-10-CM | POA: Diagnosis not present

## 2022-05-20 DIAGNOSIS — Z124 Encounter for screening for malignant neoplasm of cervix: Secondary | ICD-10-CM

## 2022-05-20 DIAGNOSIS — Z01419 Encounter for gynecological examination (general) (routine) without abnormal findings: Secondary | ICD-10-CM

## 2022-05-20 DIAGNOSIS — Z23 Encounter for immunization: Secondary | ICD-10-CM | POA: Diagnosis not present

## 2022-05-20 NOTE — Progress Notes (Signed)
History:  [name] is a 61 y.o. G2P1011 who presents to clinic today as a new patient for pap smear. The patient is up to date on mammogram. She is postmenopausal and not sexually active. She declines STD testing today. She states last pap smear approximately 5 years ago. She denies any history of abnormal pap smears, however no records are available. She denies bleeding, discharge, pain, UTI symptoms, GI or breast concerns.    The following portions of the patient's history were reviewed and updated as appropriate: allergies, current medications, family history, past medical history, social history, past surgical history and problem list.  Review of Systems:  Review of Systems  Constitutional:  Negative for fever and malaise/fatigue.  Gastrointestinal:  Negative for abdominal pain, constipation, diarrhea, nausea and vomiting.  Genitourinary:  Negative for dysuria, frequency and urgency.       Neg - vaginal bleeding, discharge, pelvic pain     Objective:  Physical Exam BP (!) 143/92   Pulse 89   Wt 145 lb 6.4 oz (66 kg)   BMI 28.55 kg/m  Physical Exam Vitals and nursing note reviewed. Exam conducted with a chaperone present.  Constitutional:      General: She is not in acute distress.    Appearance: Normal appearance. She is well-developed and normal weight.  HENT:     Head: Normocephalic and atraumatic.  Neck:     Thyroid: No thyromegaly.  Cardiovascular:     Rate and Rhythm: Normal rate and regular rhythm.     Heart sounds: No murmur heard. Pulmonary:     Effort: Pulmonary effort is normal. No respiratory distress.     Breath sounds: Normal breath sounds. No wheezing.  Chest:  Breasts:    Right: Skin change (small red patch noted) present. No swelling, bleeding, inverted nipple, mass, nipple discharge or tenderness.     Left: No swelling, bleeding, inverted nipple, mass, nipple discharge, skin change or tenderness.  Abdominal:     General: Abdomen is flat.  Bowel sounds are normal. There is no distension.     Palpations: Abdomen is soft. There is no mass.     Tenderness: There is no abdominal tenderness. There is no guarding or rebound.  Genitourinary:    General: Normal vulva.     Vagina: No vaginal discharge, erythema, tenderness or bleeding.     Cervix: Friability, erythema and cervical bleeding (small bleeding following pap smear) present. No cervical motion tenderness, discharge or lesion.     Uterus: Not enlarged and not tender.      Adnexa:        Right: No mass or tenderness.         Left: No mass or tenderness.    Musculoskeletal:     Cervical back: Neck supple.  Skin:    General: Skin is warm and dry.     Findings: No erythema.  Neurological:     Mental Status: She is alert and oriented to person, place, and time.  Psychiatric:        Mood and Affect: Mood normal.    Health Maintenance Due  Topic Date Due   PAP SMEAR-Modifier  Never done   Zoster Vaccines- Shingrix (2 of 2) 06/16/2019   COVID-19 Vaccine (4 - Moderna series) 10/03/2020    Labs, imaging and previous visits in Epic and Care Everywhere reviewed  Assessment & Plan:  1. Needs flu shot - Flu Vaccine QUAD 50moIM (Fluarix, Fluzone & Alfiuria Quad PF)  2. Cervical  cancer screening - pap smear today  - patient advised results will be available in MyChart - will follow-up as indicated. Advised that although she may be > 65 at time of next pap smear assuming normal results today, we do not have record of normal pap smears and should have at least one more before we discontinue due to age.   Patient to follow-up with [facility] in 1 year or PRN otherwise   Danielle Rankin 05/20/2022 2:35 PM

## 2022-05-23 LAB — CYTOLOGY - PAP
Comment: NEGATIVE
Diagnosis: NEGATIVE
High risk HPV: NEGATIVE

## 2022-05-28 DIAGNOSIS — Z0289 Encounter for other administrative examinations: Secondary | ICD-10-CM | POA: Diagnosis not present

## 2022-05-28 DIAGNOSIS — J0100 Acute maxillary sinusitis, unspecified: Secondary | ICD-10-CM | POA: Diagnosis not present

## 2022-05-28 DIAGNOSIS — J449 Chronic obstructive pulmonary disease, unspecified: Secondary | ICD-10-CM | POA: Diagnosis not present

## 2022-06-01 IMAGING — US US EXTREM UP*L* LTD
1 series · 14 of 15 positions shown · non-contrast
Comparison: None.

CLINICAL DATA: Left elbow mass

EXAM:
ULTRASOUND LEFT UPPER EXTREMITY LIMITED
TECHNIQUE: Ultrasound examination of the upper extremity soft tissues was
performed in the area of clinical concern.

[Series 1: us extrem up*left* ltd · 0.05mm/px · 15 acquisitions, 14 frames shown]
[im 1/15]
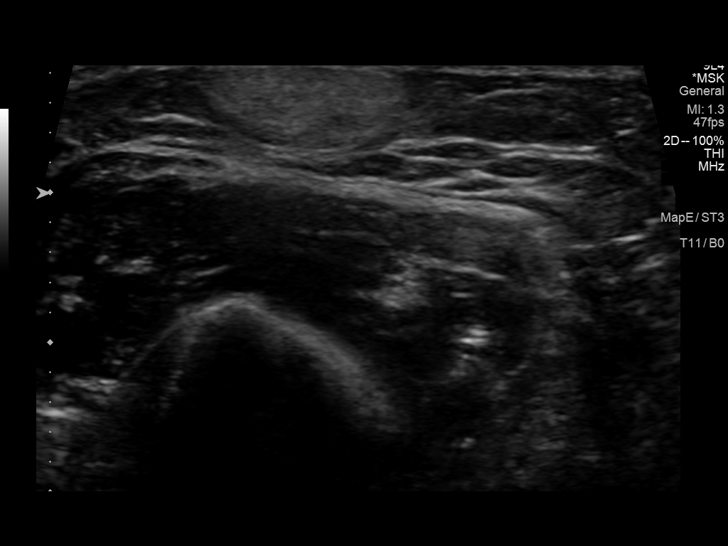
[im 2/15]
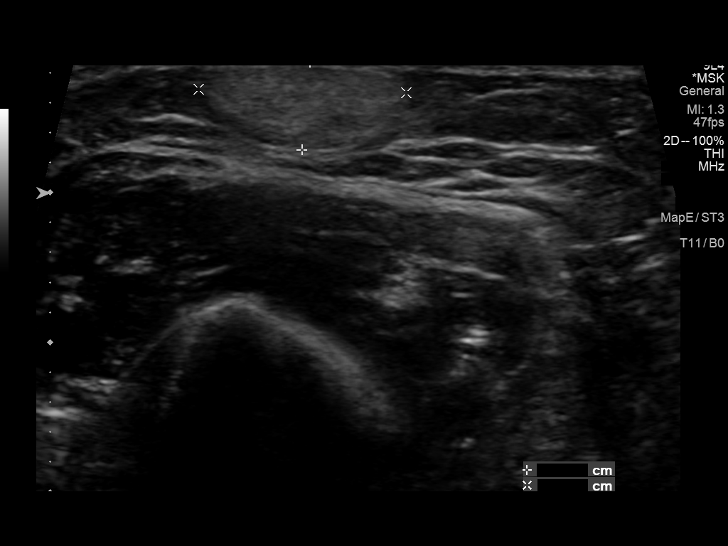
[im 3/15]
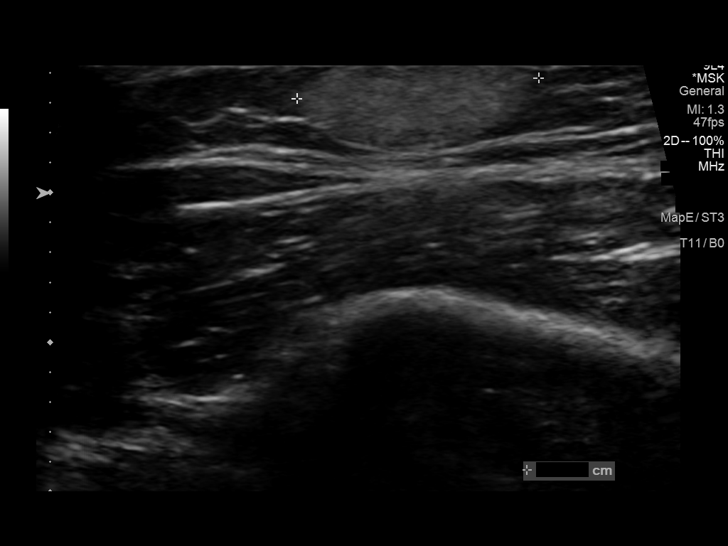
[im 4/15]
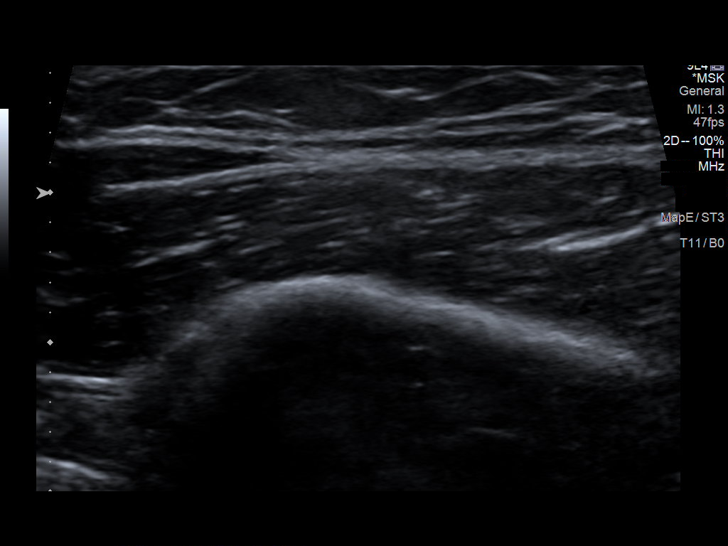
[im 5/15]
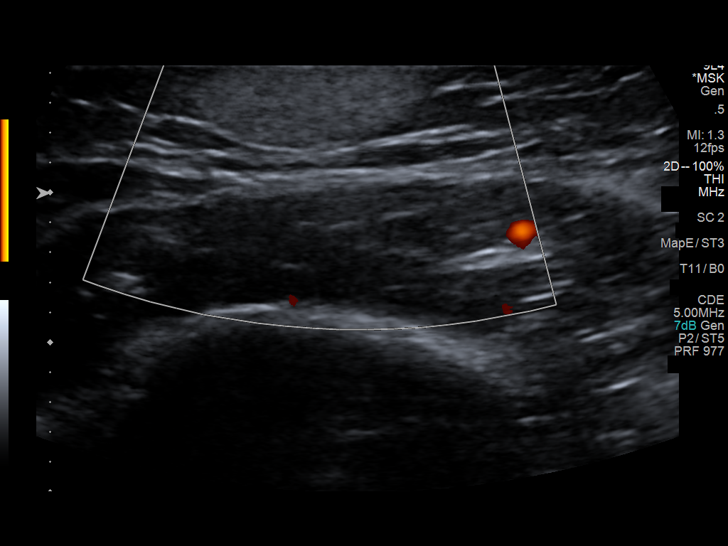
[im 6/15]
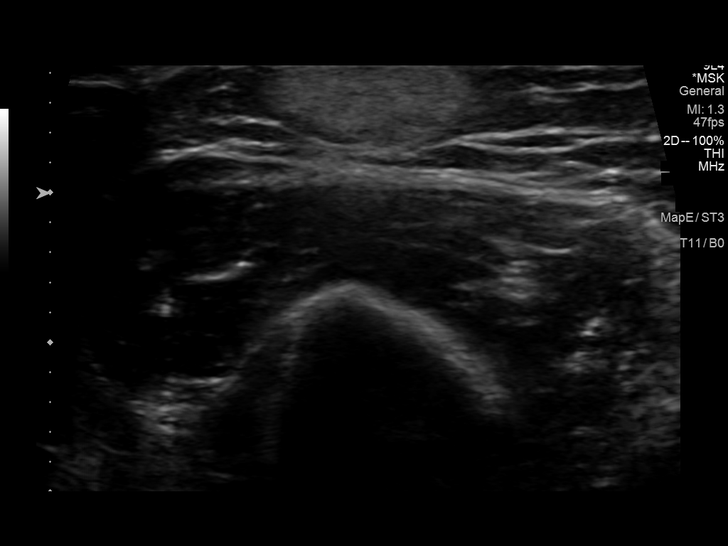
[im 7/15]
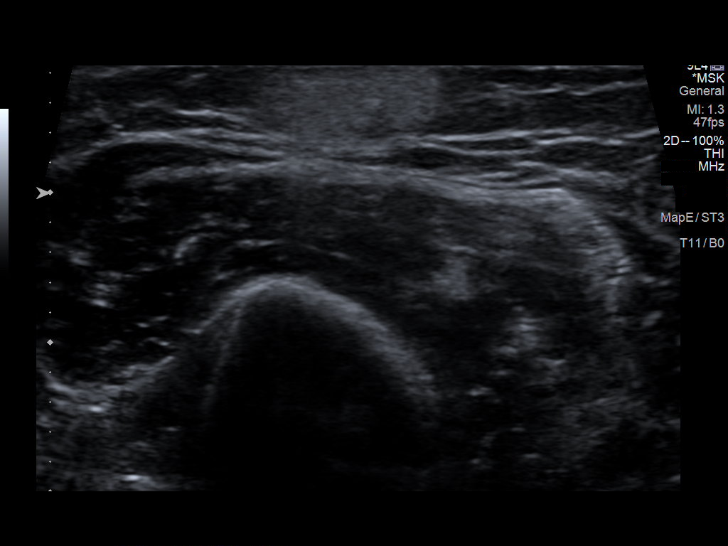
[im 9/15]
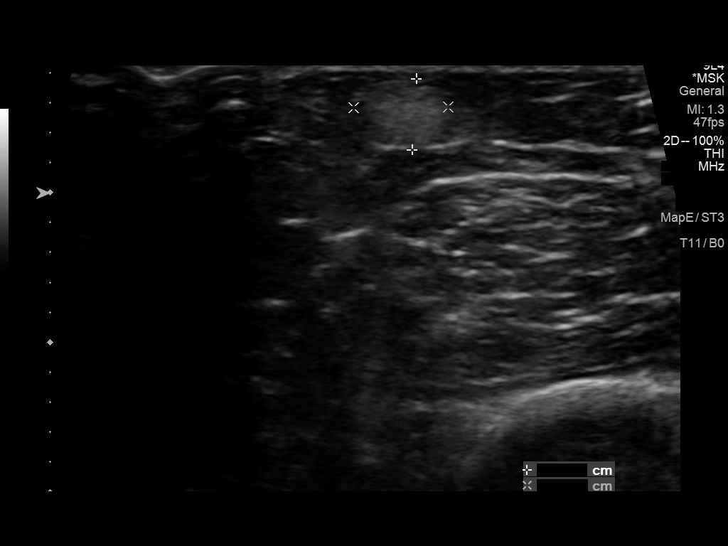
[im 10/15]
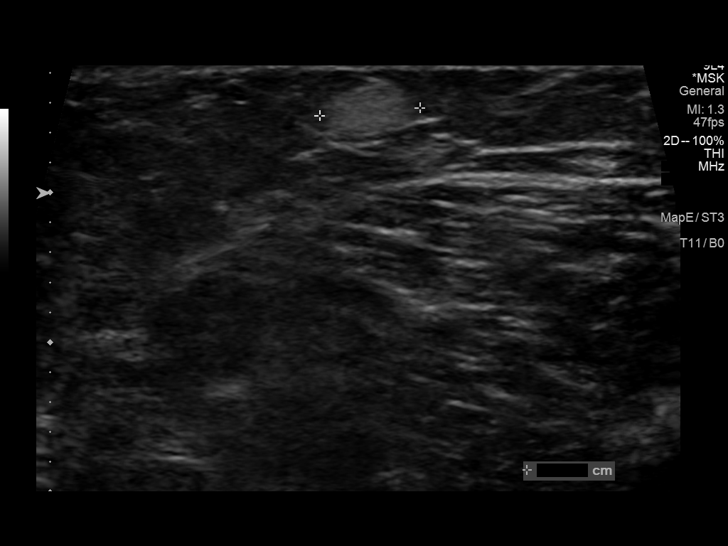
[im 11/15]
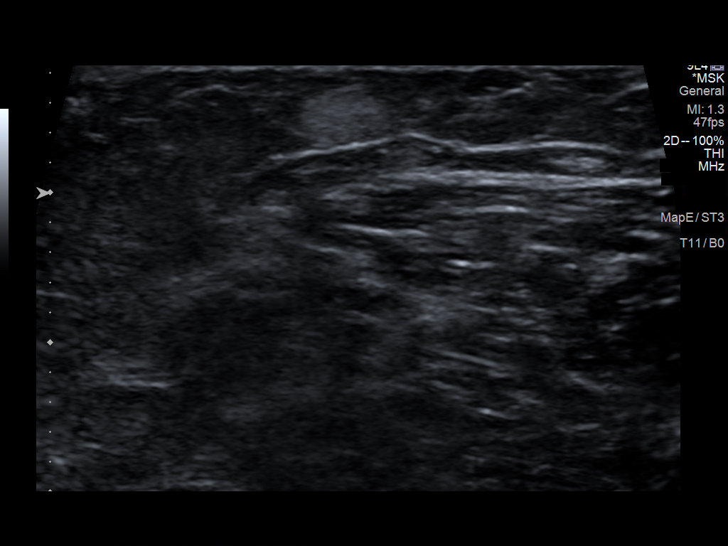
[im 12/15]
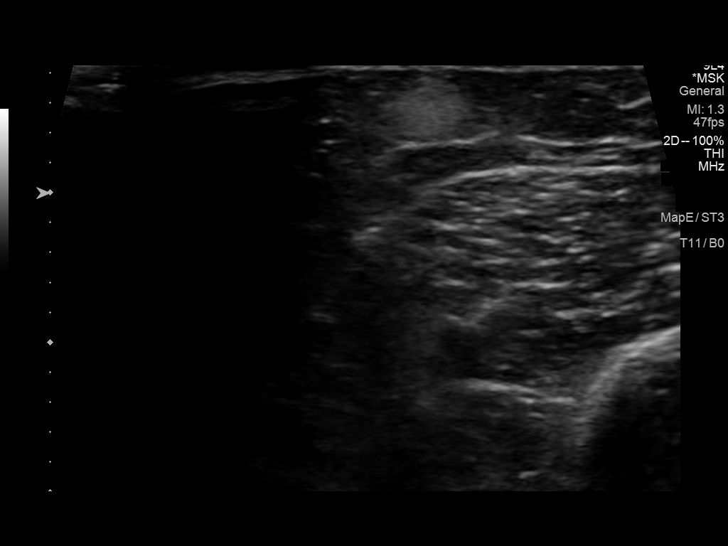
[im 13/15]
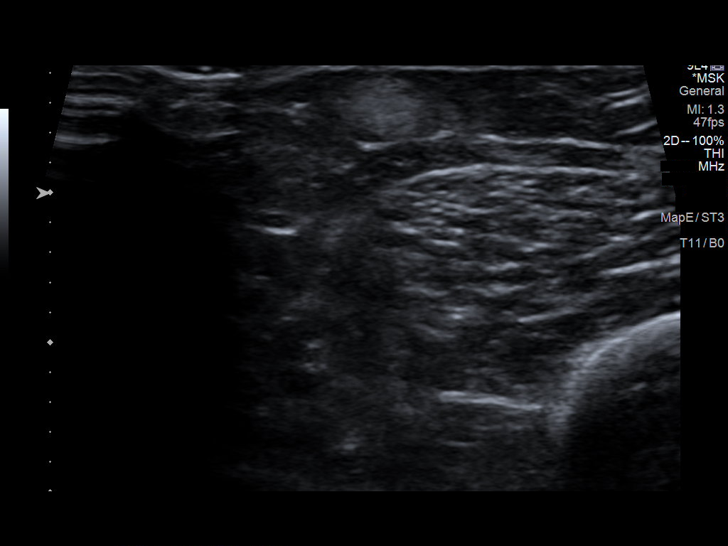
[im 14/15]
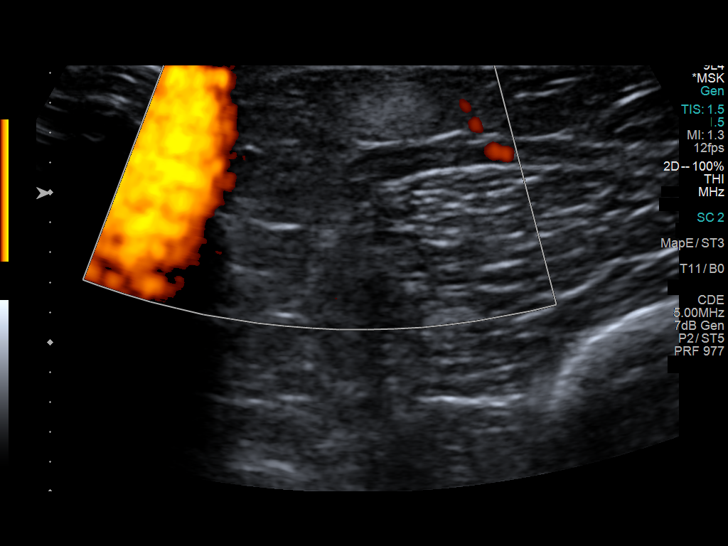
[im 15/15]
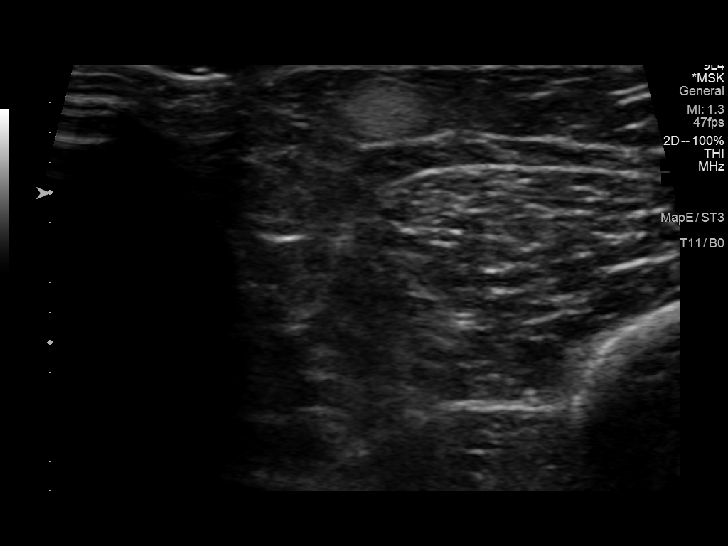

[14 of 15 positions shown; findings below may reference images not displayed]

FINDINGS: Targeted ultrasound was performed of the soft tissues of the left
upper extremity at site of patient's clinical concern. Well-defined
ovoid homogeneously hyperechoic mass within the subcutaneous soft
tissues measuring 1.6 x 0.6 x 1.4 cm. No internal vascularity. No
posterior acoustic features. Additional adjacent smaller
well-defined hyperechoic soft tissue mass, also within the
subcutaneous soft tissues measuring 0.7 x 0.5 x 0.6 cm. No adjacent
soft tissue edema or hyperemia.
IMPRESSION: Two well-defined nonvascular hyperechoic soft tissue masses within
the subcutaneous soft tissues near the left elbow. Sonographic
appearance is most compatible with lipomas.

## 2022-06-26 DIAGNOSIS — J449 Chronic obstructive pulmonary disease, unspecified: Secondary | ICD-10-CM | POA: Diagnosis not present

## 2022-06-26 DIAGNOSIS — Z0289 Encounter for other administrative examinations: Secondary | ICD-10-CM | POA: Diagnosis not present

## 2022-06-26 DIAGNOSIS — L309 Dermatitis, unspecified: Secondary | ICD-10-CM | POA: Diagnosis not present

## 2022-06-26 DIAGNOSIS — I700 Atherosclerosis of aorta: Secondary | ICD-10-CM | POA: Diagnosis not present

## 2022-07-21 IMAGING — MG MM DIGITAL SCREENING BILAT W/ TOMO AND CAD
8 series · 8 of 24 positions shown · non-contrast
Comparison: Previous exam(s).

CLINICAL DATA: Screening.

EXAM:
DIGITAL SCREENING BILATERAL MAMMOGRAM WITH TOMOSYNTHESIS AND CAD
TECHNIQUE: Bilateral screening digital craniocaudal and mediolateral oblique
mammograms were obtained. Bilateral screening digital breast
tomosynthesis was performed. The images were evaluated with
computer-aided detection.

[R CC synth-2D]
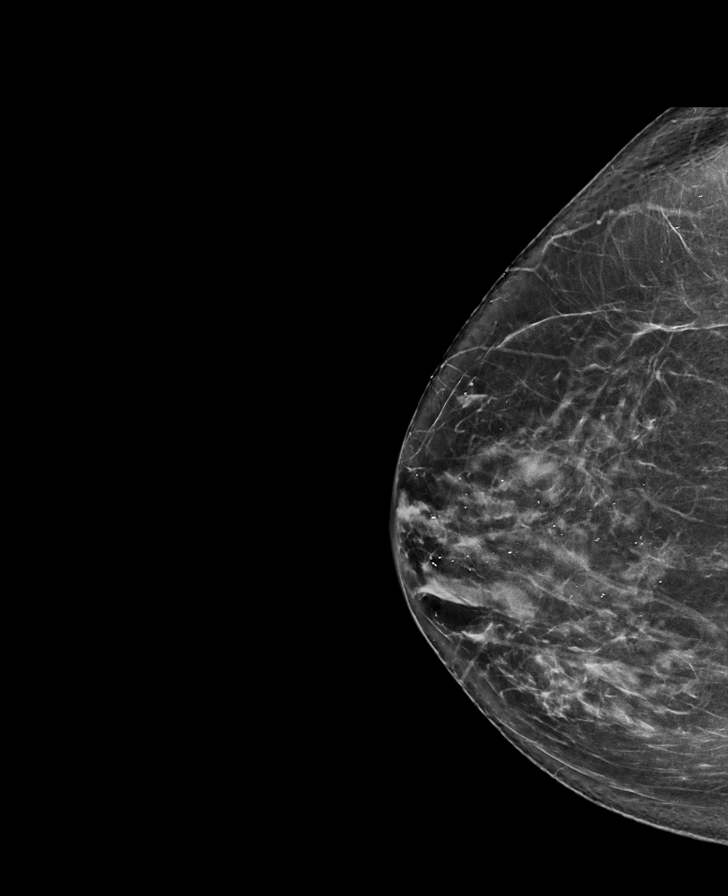

[R MLO synth-2D]
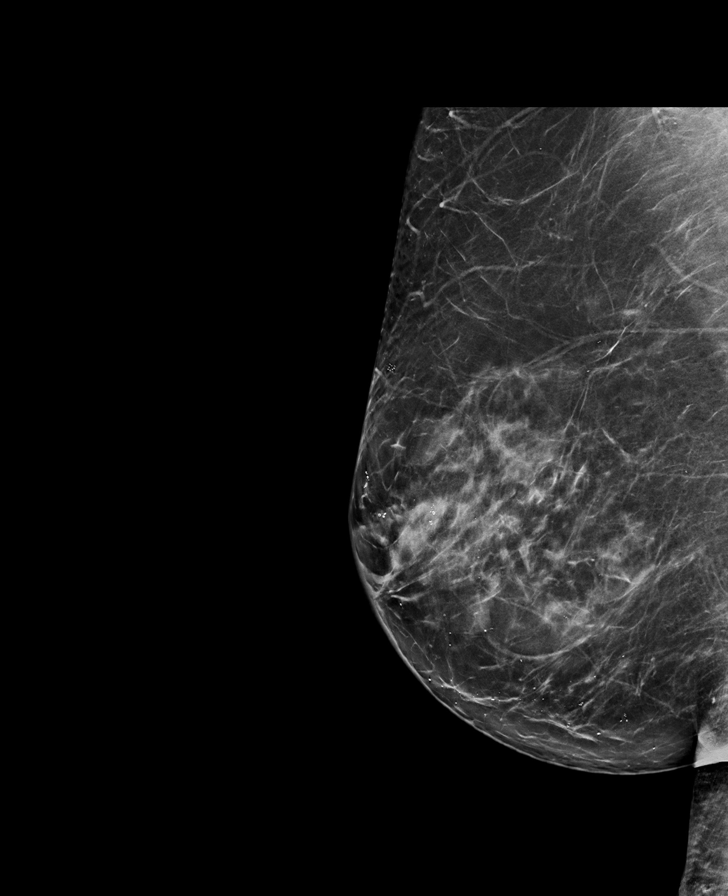

[L CC synth-2D]
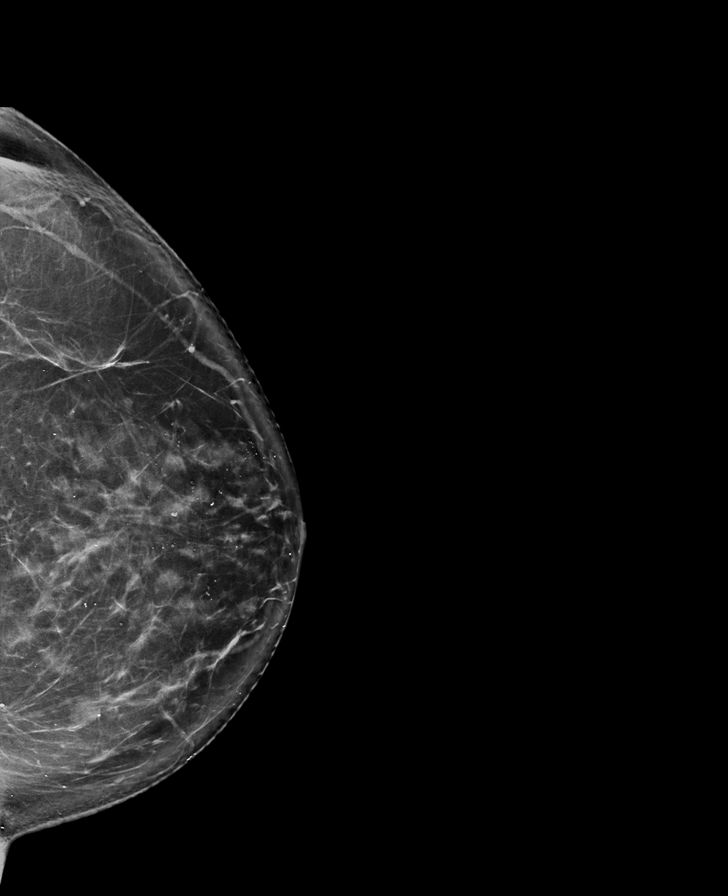

[L MLO synth-2D]
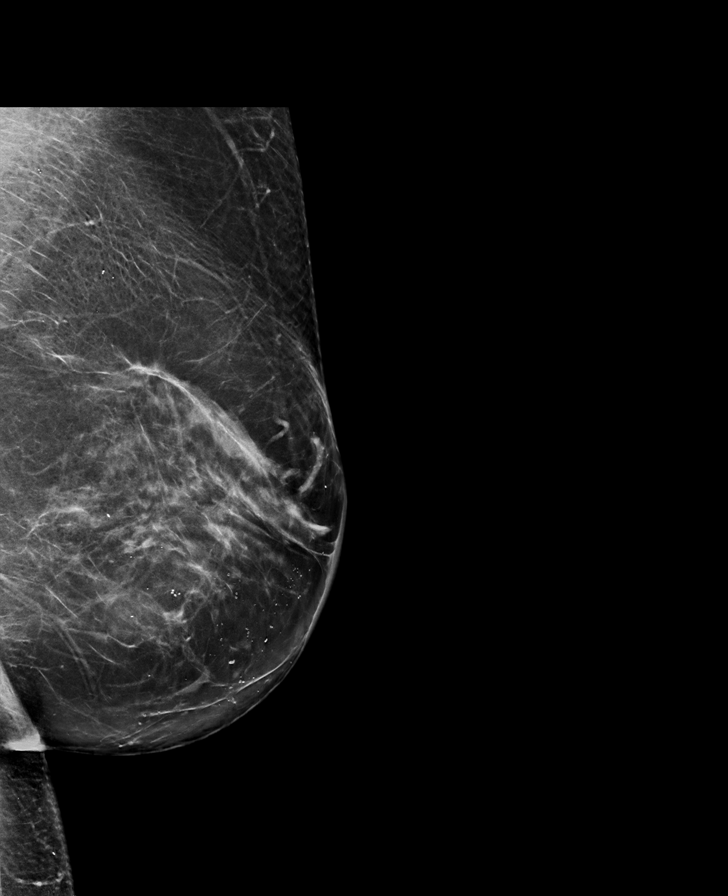

[L MLO tomo · tomo slice 45/88.0]
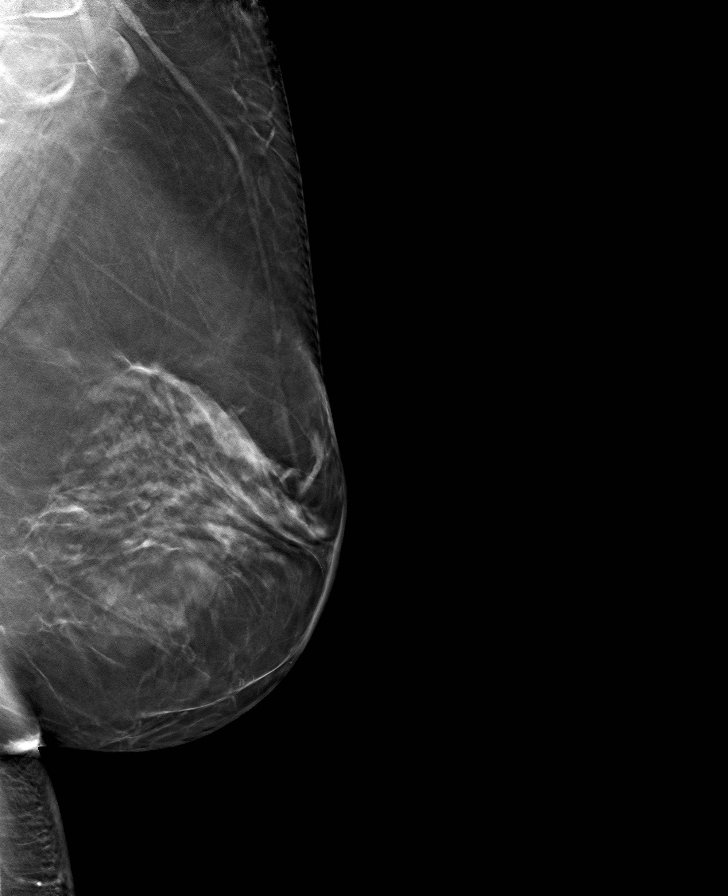

[L CC tomo · tomo slice 43/85.0]
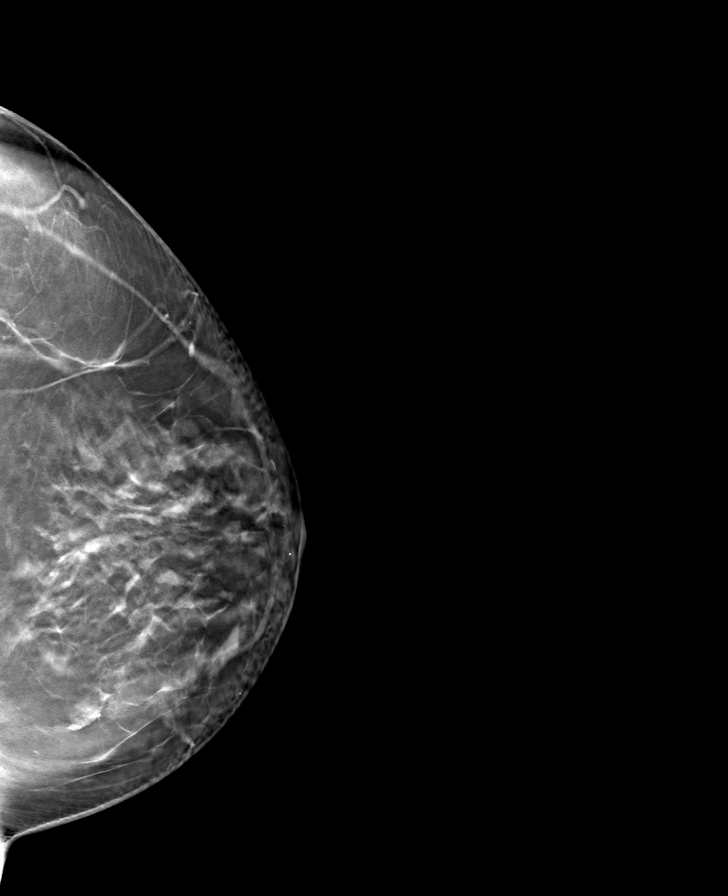

[R MLO tomo · tomo slice 41/82.0]
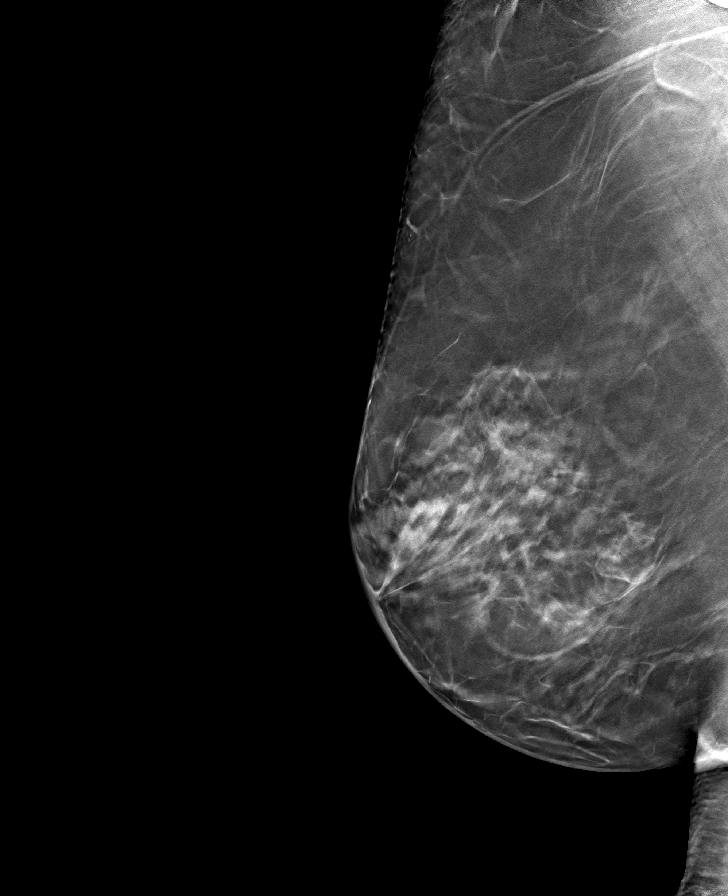

[R CC tomo · tomo slice 37/74.0]
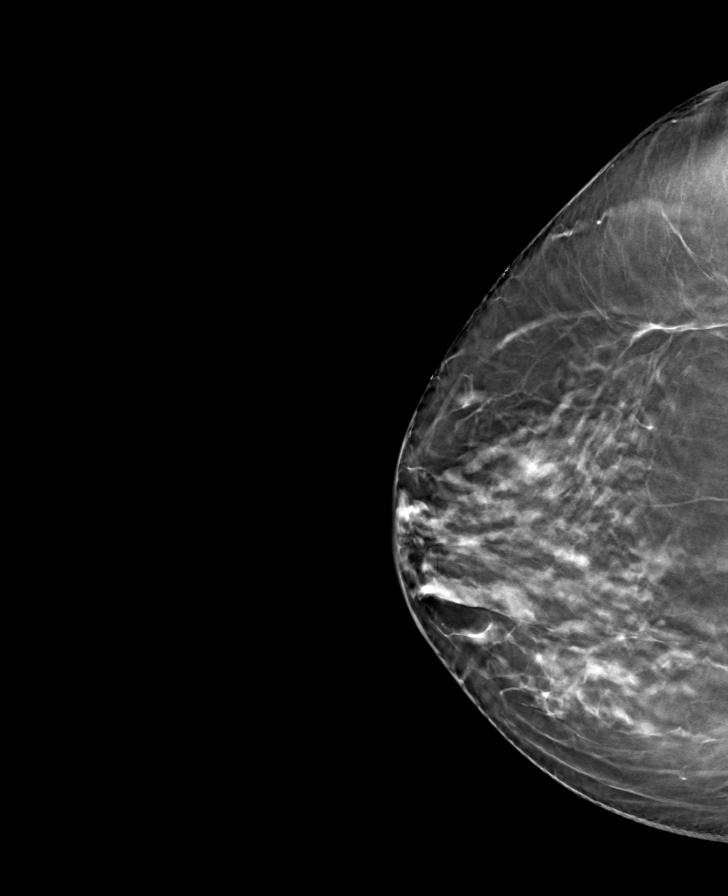

[8 of 24 positions shown; findings below may reference images not displayed]

ACR Breast Density Category c: The breast tissue is heterogeneously
dense, which may obscure small masses.
FINDINGS: There are no findings suspicious for malignancy.
IMPRESSION: No mammographic evidence of malignancy. A result letter of this
screening mammogram will be mailed directly to the patient.

RECOMMENDATION:
Screening mammogram in one year. (Code:Q3-W-BC3)

BI-RADS CATEGORY  1: Negative.

## 2022-09-01 DIAGNOSIS — Z5181 Encounter for therapeutic drug level monitoring: Secondary | ICD-10-CM | POA: Diagnosis not present

## 2022-09-23 DIAGNOSIS — Z5181 Encounter for therapeutic drug level monitoring: Secondary | ICD-10-CM | POA: Diagnosis not present

## 2022-09-23 DIAGNOSIS — L509 Urticaria, unspecified: Secondary | ICD-10-CM | POA: Diagnosis not present

## 2022-09-23 DIAGNOSIS — L299 Pruritus, unspecified: Secondary | ICD-10-CM | POA: Diagnosis not present

## 2022-09-26 DIAGNOSIS — L509 Urticaria, unspecified: Secondary | ICD-10-CM | POA: Diagnosis not present

## 2022-09-26 DIAGNOSIS — G4709 Other insomnia: Secondary | ICD-10-CM | POA: Diagnosis not present

## 2022-09-26 DIAGNOSIS — J449 Chronic obstructive pulmonary disease, unspecified: Secondary | ICD-10-CM | POA: Diagnosis not present

## 2022-09-26 DIAGNOSIS — I700 Atherosclerosis of aorta: Secondary | ICD-10-CM | POA: Diagnosis not present

## 2022-09-26 DIAGNOSIS — Z0289 Encounter for other administrative examinations: Secondary | ICD-10-CM | POA: Diagnosis not present

## 2022-09-26 DIAGNOSIS — L309 Dermatitis, unspecified: Secondary | ICD-10-CM | POA: Diagnosis not present

## 2022-10-20 DIAGNOSIS — H04123 Dry eye syndrome of bilateral lacrimal glands: Secondary | ICD-10-CM | POA: Diagnosis not present

## 2022-10-20 DIAGNOSIS — H2513 Age-related nuclear cataract, bilateral: Secondary | ICD-10-CM | POA: Diagnosis not present

## 2022-10-20 DIAGNOSIS — H524 Presbyopia: Secondary | ICD-10-CM | POA: Diagnosis not present

## 2022-10-20 DIAGNOSIS — H401131 Primary open-angle glaucoma, bilateral, mild stage: Secondary | ICD-10-CM | POA: Diagnosis not present

## 2022-12-17 DIAGNOSIS — N3281 Overactive bladder: Secondary | ICD-10-CM | POA: Diagnosis not present

## 2022-12-17 DIAGNOSIS — J449 Chronic obstructive pulmonary disease, unspecified: Secondary | ICD-10-CM | POA: Diagnosis not present

## 2022-12-17 DIAGNOSIS — M1712 Unilateral primary osteoarthritis, left knee: Secondary | ICD-10-CM | POA: Diagnosis not present

## 2022-12-17 DIAGNOSIS — Z0289 Encounter for other administrative examinations: Secondary | ICD-10-CM | POA: Diagnosis not present

## 2022-12-17 DIAGNOSIS — Z299 Encounter for prophylactic measures, unspecified: Secondary | ICD-10-CM | POA: Diagnosis not present

## 2022-12-17 DIAGNOSIS — H0263 Xanthelasma of right eye, unspecified eyelid: Secondary | ICD-10-CM | POA: Diagnosis not present

## 2022-12-17 DIAGNOSIS — D179 Benign lipomatous neoplasm, unspecified: Secondary | ICD-10-CM | POA: Diagnosis not present

## 2022-12-30 DIAGNOSIS — L509 Urticaria, unspecified: Secondary | ICD-10-CM | POA: Diagnosis not present

## 2022-12-30 DIAGNOSIS — L811 Chloasma: Secondary | ICD-10-CM | POA: Diagnosis not present

## 2023-01-05 DIAGNOSIS — Z5181 Encounter for therapeutic drug level monitoring: Secondary | ICD-10-CM | POA: Diagnosis not present

## 2023-01-09 DIAGNOSIS — H2512 Age-related nuclear cataract, left eye: Secondary | ICD-10-CM | POA: Diagnosis not present

## 2023-01-09 DIAGNOSIS — H04123 Dry eye syndrome of bilateral lacrimal glands: Secondary | ICD-10-CM | POA: Diagnosis not present

## 2023-01-09 DIAGNOSIS — H524 Presbyopia: Secondary | ICD-10-CM | POA: Diagnosis not present

## 2023-01-09 DIAGNOSIS — H401131 Primary open-angle glaucoma, bilateral, mild stage: Secondary | ICD-10-CM | POA: Diagnosis not present

## 2023-01-09 DIAGNOSIS — H2513 Age-related nuclear cataract, bilateral: Secondary | ICD-10-CM | POA: Diagnosis not present

## 2023-01-20 DIAGNOSIS — R300 Dysuria: Secondary | ICD-10-CM | POA: Diagnosis not present

## 2023-01-20 DIAGNOSIS — Z0289 Encounter for other administrative examinations: Secondary | ICD-10-CM | POA: Diagnosis not present

## 2023-01-20 DIAGNOSIS — G4709 Other insomnia: Secondary | ICD-10-CM | POA: Diagnosis not present

## 2023-01-20 DIAGNOSIS — J449 Chronic obstructive pulmonary disease, unspecified: Secondary | ICD-10-CM | POA: Diagnosis not present

## 2023-02-02 DIAGNOSIS — Z5181 Encounter for therapeutic drug level monitoring: Secondary | ICD-10-CM | POA: Diagnosis not present

## 2023-04-06 DIAGNOSIS — Z5181 Encounter for therapeutic drug level monitoring: Secondary | ICD-10-CM | POA: Diagnosis not present

## 2023-04-09 DIAGNOSIS — H401121 Primary open-angle glaucoma, left eye, mild stage: Secondary | ICD-10-CM | POA: Diagnosis not present

## 2023-04-09 DIAGNOSIS — H2512 Age-related nuclear cataract, left eye: Secondary | ICD-10-CM | POA: Diagnosis not present

## 2023-04-09 DIAGNOSIS — H2511 Age-related nuclear cataract, right eye: Secondary | ICD-10-CM | POA: Diagnosis not present

## 2023-04-16 DIAGNOSIS — Z599 Problem related to housing and economic circumstances, unspecified: Secondary | ICD-10-CM | POA: Diagnosis not present

## 2023-04-16 DIAGNOSIS — I700 Atherosclerosis of aorta: Secondary | ICD-10-CM | POA: Diagnosis not present

## 2023-04-16 DIAGNOSIS — J449 Chronic obstructive pulmonary disease, unspecified: Secondary | ICD-10-CM | POA: Diagnosis not present

## 2023-04-16 DIAGNOSIS — Z0289 Encounter for other administrative examinations: Secondary | ICD-10-CM | POA: Diagnosis not present

## 2023-04-16 DIAGNOSIS — G4709 Other insomnia: Secondary | ICD-10-CM | POA: Diagnosis not present

## 2023-04-30 DIAGNOSIS — H2511 Age-related nuclear cataract, right eye: Secondary | ICD-10-CM | POA: Diagnosis not present

## 2023-04-30 DIAGNOSIS — H401111 Primary open-angle glaucoma, right eye, mild stage: Secondary | ICD-10-CM | POA: Diagnosis not present

## 2023-05-04 DIAGNOSIS — Z5181 Encounter for therapeutic drug level monitoring: Secondary | ICD-10-CM | POA: Diagnosis not present

## 2023-05-12 ENCOUNTER — Other Ambulatory Visit: Payer: Self-pay | Admitting: Internal Medicine

## 2023-05-12 DIAGNOSIS — Z1231 Encounter for screening mammogram for malignant neoplasm of breast: Secondary | ICD-10-CM

## 2023-05-14 DIAGNOSIS — J3089 Other allergic rhinitis: Secondary | ICD-10-CM | POA: Diagnosis not present

## 2023-05-14 DIAGNOSIS — L989 Disorder of the skin and subcutaneous tissue, unspecified: Secondary | ICD-10-CM | POA: Diagnosis not present

## 2023-05-14 DIAGNOSIS — Z0289 Encounter for other administrative examinations: Secondary | ICD-10-CM | POA: Diagnosis not present

## 2023-05-29 ENCOUNTER — Ambulatory Visit
Admission: RE | Admit: 2023-05-29 | Discharge: 2023-05-29 | Disposition: A | Discharge status: Home / Self Care | Payer: 59 | Source: Ambulatory Visit | Attending: Internal Medicine | Admitting: Internal Medicine

## 2023-05-29 DIAGNOSIS — Z1231 Encounter for screening mammogram for malignant neoplasm of breast: Secondary | ICD-10-CM | POA: Diagnosis not present

## 2023-06-08 DIAGNOSIS — Z5181 Encounter for therapeutic drug level monitoring: Secondary | ICD-10-CM | POA: Diagnosis not present

## 2023-06-09 DIAGNOSIS — L820 Inflamed seborrheic keratosis: Secondary | ICD-10-CM | POA: Diagnosis not present

## 2023-06-09 DIAGNOSIS — H0260 Xanthelasma of unspecified eye, unspecified eyelid: Secondary | ICD-10-CM | POA: Diagnosis not present

## 2024-05-23 ENCOUNTER — Other Ambulatory Visit: Payer: Self-pay | Admitting: Internal Medicine

## 2024-05-23 ENCOUNTER — Other Ambulatory Visit: Payer: Self-pay | Admitting: Family Medicine

## 2024-05-23 DIAGNOSIS — Z1231 Encounter for screening mammogram for malignant neoplasm of breast: Secondary | ICD-10-CM

## 2024-05-31 ENCOUNTER — Ambulatory Visit
Admission: RE | Admit: 2024-05-31 | Discharge: 2024-05-31 | Disposition: A | Source: Ambulatory Visit | Attending: Internal Medicine | Admitting: Internal Medicine

## 2024-05-31 DIAGNOSIS — Z1231 Encounter for screening mammogram for malignant neoplasm of breast: Secondary | ICD-10-CM
